# Patient Record
Sex: Female | Born: 1970 | Race: Black or African American | Hispanic: No | Marital: Married | Smoking: Never smoker
Health system: Southern US, Community
[De-identification: ages and names within clinical notes are randomized; demographics above are authoritative.]

## PROBLEM LIST (undated history)

## (undated) DIAGNOSIS — Z8041 Family history of malignant neoplasm of ovary: Secondary | ICD-10-CM

## (undated) DIAGNOSIS — M722 Plantar fascial fibromatosis: Secondary | ICD-10-CM

## (undated) DIAGNOSIS — K219 Gastro-esophageal reflux disease without esophagitis: Secondary | ICD-10-CM

## (undated) DIAGNOSIS — N920 Excessive and frequent menstruation with regular cycle: Secondary | ICD-10-CM

## (undated) DIAGNOSIS — D75839 Thrombocytosis, unspecified: Secondary | ICD-10-CM

## (undated) DIAGNOSIS — F419 Anxiety disorder, unspecified: Secondary | ICD-10-CM

## (undated) DIAGNOSIS — Z803 Family history of malignant neoplasm of breast: Secondary | ICD-10-CM

## (undated) DIAGNOSIS — D473 Essential (hemorrhagic) thrombocythemia: Secondary | ICD-10-CM

## (undated) DIAGNOSIS — D649 Anemia, unspecified: Secondary | ICD-10-CM

## (undated) HISTORY — DX: Essential (hemorrhagic) thrombocythemia: D47.3

## (undated) HISTORY — DX: Thrombocytosis, unspecified: D75.839

## (undated) HISTORY — DX: Excessive and frequent menstruation with regular cycle: N92.0

## (undated) HISTORY — DX: Anemia, unspecified: D64.9

## (undated) HISTORY — DX: Anxiety disorder, unspecified: F41.9

## (undated) HISTORY — DX: Family history of malignant neoplasm of breast: Z80.3

## (undated) HISTORY — DX: Family history of malignant neoplasm of ovary: Z80.41

## (undated) HISTORY — PX: CHOLECYSTECTOMY: SHX55

## (undated) HISTORY — DX: Gastro-esophageal reflux disease without esophagitis: K21.9

## (undated) HISTORY — DX: Plantar fascial fibromatosis: M72.2

## (undated) HISTORY — PX: BREAST SURGERY: SHX581

## (undated) HISTORY — PX: ABDOMINAL HYSTERECTOMY: SHX81

---

## 1998-03-05 HISTORY — PX: GASTRIC BYPASS: SHX52

## 2007-09-30 ENCOUNTER — Other Ambulatory Visit: Payer: Self-pay

## 2007-09-30 ENCOUNTER — Emergency Department: Payer: Self-pay | Admitting: Emergency Medicine

## 2014-03-05 DIAGNOSIS — Z1371 Encounter for nonprocreative screening for genetic disease carrier status: Secondary | ICD-10-CM

## 2014-03-05 HISTORY — DX: Encounter for nonprocreative screening for genetic disease carrier status: Z13.71

## 2014-06-08 ENCOUNTER — Inpatient Hospital Stay
Admit: 2014-06-08 | Disposition: A | Discharge status: Home / Self Care | Payer: Self-pay | Attending: Internal Medicine | Admitting: Internal Medicine

## 2014-06-08 DIAGNOSIS — I340 Nonrheumatic mitral (valve) insufficiency: Secondary | ICD-10-CM | POA: Diagnosis not present

## 2014-06-08 LAB — FERRITIN: FERRITIN (ARMC): 2 ng/mL — AB

## 2014-06-08 LAB — CBC
HCT: 14.6 % — AB (ref 35.0–47.0)
HGB: 3.7 g/dL — AB (ref 12.0–16.0)
MCH: 16.2 pg — ABNORMAL LOW (ref 26.0–34.0)
MCHC: <26.5 g/dL (ref 32.0–36.0)
MCV: 64 fL — AB (ref 80–100)
Platelet: 927 10*3/uL — ABNORMAL HIGH (ref 150–440)
RBC: 2.29 10*6/uL — AB (ref 3.80–5.20)
RDW: 42.5 % — ABNORMAL HIGH (ref 11.5–14.5)
WBC: 6.5 10*3/uL (ref 3.6–11.0)

## 2014-06-08 LAB — DIFFERENTIAL
BASOS ABS: 0.1 10*3/uL (ref 0.0–0.1)
Basophil %: 1 %
Eosinophil #: 0.1 10*3/uL (ref 0.0–0.7)
Eosinophil %: 2.1 %
LYMPHS ABS: 1.6 10*3/uL (ref 1.0–3.6)
Lymphocyte %: 24.9 %
MONO ABS: 0.7 x10 3/mm (ref 0.2–0.9)
MONOS PCT: 10 %
NEUTROS ABS: 4 10*3/uL (ref 1.4–6.5)
Neutrophil %: 62 %

## 2014-06-08 LAB — COMPREHENSIVE METABOLIC PANEL
ANION GAP: 9 (ref 7–16)
Albumin: 3.6 g/dL
Alkaline Phosphatase: 50 U/L
BUN: 6 mg/dL
Bilirubin,Total: 0.2 mg/dL — ABNORMAL LOW
CALCIUM: 8.3 mg/dL — AB
CHLORIDE: 109 mmol/L
CREATININE: 0.45 mg/dL
Co2: 22 mmol/L
EGFR (African American): >60
Glucose: 86 mg/dL
POTASSIUM: 3.7 mmol/L
SGOT(AST): 23 U/L
SGPT (ALT): 16 U/L
SODIUM: 140 mmol/L
Total Protein: 6.6 g/dL

## 2014-06-08 LAB — IRON AND TIBC
IRON SATURATION: 1.1
Iron Bind.Cap.(Total): 545 — ABNORMAL HIGH (ref 250–450)
Iron: 6 ug/dL — ABNORMAL LOW
Unbound Iron-Bind.Cap.: 539.4

## 2014-06-08 LAB — PRO B NATRIURETIC PEPTIDE: B-Type Natriuretic Peptide: 175 pg/mL — ABNORMAL HIGH

## 2014-06-08 LAB — CK TOTAL AND CKMB (NOT AT ARMC)
CK, Total: 86 U/L
CK-MB: 0.6 ng/mL

## 2014-06-08 LAB — TROPONIN I: Troponin-I: <0.03 ng/mL

## 2014-06-09 LAB — BASIC METABOLIC PANEL
Anion Gap: 5 — ABNORMAL LOW (ref 7–16)
BUN: 7 mg/dL
CALCIUM: 7.8 mg/dL — AB
CHLORIDE: 108 mmol/L
Co2: 25 mmol/L
Creatinine: 0.39 mg/dL — ABNORMAL LOW
GLUCOSE: 79 mg/dL
Potassium: 3.5 mmol/L
Sodium: 138 mmol/L

## 2014-06-09 LAB — CBC WITH DIFFERENTIAL/PLATELET
Basophil #: 0.1 10*3/uL (ref 0.0–0.1)
Basophil %: 1.8 %
EOS ABS: 0.2 10*3/uL (ref 0.0–0.7)
Eosinophil %: 3.2 %
HCT: 14 % — CL (ref 35.0–47.0)
HGB: 3.8 g/dL — AB (ref 12.0–16.0)
LYMPHS ABS: 1.6 10*3/uL (ref 1.0–3.6)
Lymphocyte %: 29.3 %
MCH: 17.8 pg — AB (ref 26.0–34.0)
MCHC: 27.1 g/dL — AB (ref 32.0–36.0)
MCV: 66 fL — ABNORMAL LOW (ref 80–100)
Monocyte #: 0.5 x10 3/mm (ref 0.2–0.9)
Monocyte %: 8.8 %
NEUTROS PCT: 56.9 %
Neutrophil #: 3.1 10*3/uL (ref 1.4–6.5)
PLATELETS: 916 10*3/uL — AB (ref 150–440)
RBC: 2.14 10*6/uL — AB (ref 3.80–5.20)
RDW: 41.6 % — ABNORMAL HIGH (ref 11.5–14.5)
WBC: 5.5 10*3/uL (ref 3.6–11.0)

## 2014-06-10 LAB — CBC WITH DIFFERENTIAL/PLATELET
BASOS ABS: 0.1 10*3/uL (ref 0.0–0.1)
BASOS PCT: 1.9 %
Eosinophil #: 0.3 10*3/uL (ref 0.0–0.7)
Eosinophil %: 4.3 %
HCT: 20.3 % — AB (ref 35.0–47.0)
HGB: 5.9 g/dL — AB (ref 12.0–16.0)
LYMPHS PCT: 26.3 %
Lymphocyte #: 1.9 10*3/uL (ref 1.0–3.6)
MCH: 20.5 pg — AB (ref 26.0–34.0)
MCHC: 29.2 g/dL — ABNORMAL LOW (ref 32.0–36.0)
MCV: 70 fL — ABNORMAL LOW (ref 80–100)
Monocyte #: 0.7 x10 3/mm (ref 0.2–0.9)
Monocyte %: 9.3 %
NEUTROS ABS: 4.3 10*3/uL (ref 1.4–6.5)
Neutrophil %: 58.2 %
Platelet: 911 10*3/uL — ABNORMAL HIGH (ref 150–440)
RBC: 2.9 10*6/uL — ABNORMAL LOW (ref 3.80–5.20)
RDW: 39 % — ABNORMAL HIGH (ref 11.5–14.5)
WBC: 7.4 10*3/uL (ref 3.6–11.0)

## 2014-06-10 LAB — PREGNANCY, URINE: Pregnancy Test, Urine: NEGATIVE m[IU]/mL

## 2014-06-10 LAB — OCCULT BLOOD X 1 CARD TO LAB, STOOL: OCCULT BLOOD, FECES: NEGATIVE

## 2014-06-24 ENCOUNTER — Ambulatory Visit
Admit: 2014-06-24 | Disposition: A | Discharge status: Home / Self Care | Payer: Self-pay | Attending: Hematology and Oncology | Admitting: Hematology and Oncology

## 2014-06-24 LAB — CBC CANCER CENTER
Basophil #: 0.1 x10 3/mm (ref 0.0–0.1)
Basophil %: 2.2 %
Basophil: 1 %
Eosinophil #: 0.3 x10 3/mm (ref 0.0–0.7)
Eosinophil %: 5.9 %
Eosinophil: 5 %
HCT: 30.5 % — ABNORMAL LOW (ref 35.0–47.0)
HGB: 8.9 g/dL — ABNORMAL LOW (ref 12.0–16.0)
Lymphocyte #: 1.5 x10 3/mm (ref 1.0–3.6)
Lymphocyte %: 30.5 %
Lymphocytes: 34 %
MCH: 21.8 pg — ABNORMAL LOW (ref 26.0–34.0)
MCHC: 29.3 g/dL — ABNORMAL LOW (ref 32.0–36.0)
MCV: 74 fL — ABNORMAL LOW (ref 80–100)
Monocyte #: 0.6 x10 3/mm (ref 0.2–0.9)
Monocyte %: 11.3 %
Monocytes: 7 %
Neutrophil #: 2.5 x10 3/mm (ref 1.4–6.5)
Neutrophil %: 50.1 %
Platelet: 52 x10 3/mm — ABNORMAL LOW (ref 150–440)
RBC: 4.1 10*6/uL (ref 3.80–5.20)
RDW: 34.8 % — ABNORMAL HIGH (ref 11.5–14.5)
Segmented Neutrophils: 53 %
WBC: 4.9 x10 3/mm (ref 3.6–11.0)

## 2014-06-24 LAB — IRON AND TIBC
Iron Bind.Cap.(Total): 525 — ABNORMAL HIGH (ref 250–450)
Iron Saturation: 20.6
Iron: 108 ug/dL
Unbound Iron-Bind.Cap.: 416.6

## 2014-06-24 LAB — FERRITIN: Ferritin (ARMC): 6 ng/mL — ABNORMAL LOW

## 2014-06-24 LAB — RETICULOCYTES
Absolute Retic Count: 0.0773 10*6/uL (ref 0.019–0.186)
Reticulocyte: 1.89 % (ref 0.4–3.1)

## 2014-06-24 LAB — FOLATE: Folic Acid: 31 ng/mL

## 2014-06-25 ENCOUNTER — Telehealth: Payer: Self-pay | Admitting: Genetic Counselor

## 2014-06-25 LAB — CBC CANCER CENTER
Basophil #: 0.1 x10 3/mm (ref 0.0–0.1)
Basophil %: 2.3 %
Eosinophil #: 0.3 x10 3/mm (ref 0.0–0.7)
Eosinophil %: 6.2 %
HCT: 29 % — ABNORMAL LOW (ref 35.0–47.0)
HGB: 8.8 g/dL — ABNORMAL LOW (ref 12.0–16.0)
Lymphocyte #: 1.6 x10 3/mm (ref 1.0–3.6)
Lymphocyte %: 34.7 %
MCH: 22.4 pg — ABNORMAL LOW (ref 26.0–34.0)
MCHC: 30.3 g/dL — ABNORMAL LOW (ref 32.0–36.0)
MCV: 74 fL — ABNORMAL LOW (ref 80–100)
Monocyte #: 0.5 x10 3/mm (ref 0.2–0.9)
Monocyte %: 11.7 %
Neutrophil #: 2.1 x10 3/mm (ref 1.4–6.5)
Neutrophil %: 45.1 %
Platelet: 52 x10 3/mm — ABNORMAL LOW (ref 150–440)
RBC: 3.93 10*6/uL (ref 3.80–5.20)
RDW: 34.9 % — ABNORMAL HIGH (ref 11.5–14.5)
WBC: 4.6 x10 3/mm (ref 3.6–11.0)

## 2014-06-25 NOTE — Telephone Encounter (Signed)
GENETIC REFERRAL-LEFT MESSAGE FOR PATIENT TO RETURN CALL TO SCHEDULE APPT

## 2014-06-29 LAB — APTT: Activated PTT: 31.4 secs (ref 23.6–35.9)

## 2014-06-29 LAB — PROTIME-INR
INR: 1
Prothrombin Time: 13.3 secs

## 2014-06-30 ENCOUNTER — Encounter: Payer: Self-pay | Admitting: Genetic Counselor

## 2014-06-30 ENCOUNTER — Ambulatory Visit (HOSPITAL_BASED_OUTPATIENT_CLINIC_OR_DEPARTMENT_OTHER): Payer: Managed Care, Other (non HMO) | Admitting: Genetic Counselor

## 2014-06-30 ENCOUNTER — Other Ambulatory Visit: Payer: Managed Care, Other (non HMO)

## 2014-06-30 DIAGNOSIS — Z8041 Family history of malignant neoplasm of ovary: Secondary | ICD-10-CM | POA: Diagnosis not present

## 2014-06-30 DIAGNOSIS — Z315 Encounter for genetic counseling: Secondary | ICD-10-CM

## 2014-06-30 DIAGNOSIS — Z803 Family history of malignant neoplasm of breast: Secondary | ICD-10-CM | POA: Diagnosis not present

## 2014-06-30 NOTE — Progress Notes (Signed)
REFERRING PROVIDER: Melissa Corcoran, MD  PRIMARY PROVIDER:  No primary care provider on file.  PRIMARY REASON FOR VISIT:  1. Family history of ovarian cancer   2. Family history of breast cancer      HISTORY OF PRESENT ILLNESS:   [name], a 44 y.o. female, was seen for a [place] cancer genetics consultation at the request of Dr. Corcoran due to a family history of cancer.  Ms. Mucha presents to clinic today to discuss the possibility of a hereditary predisposition to cancer, genetic testing, and to further clarify her future cancer risks, as well as potential cancer risks for family members. [name] has no personal history of cancer.  She is undergoing a hysterectomy on Jul 20, 2014, and wants to know if she should also have her ovaries removed.  CANCER HISTORY:   No history exists.     HORMONAL RISK FACTORS:  Menarche was at age 11.  First live birth at age 24.  OCP use for approximately 1 years.  Ovaries intact: yes.  Hysterectomy: no, but scheduled for 07/20/2014.  Menopausal status: premenopausal.  HRT use: 0 years. Colonoscopy: no; not examined. Mammogram within the last year: yes. Number of breast biopsies: 0. Up to date with pelvic exams:  no. Any excessive radiation exposure in the past:  no  Past Medical History  Diagnosis Date  . Family history of ovarian cancer   . Family history of breast cancer     History reviewed. No pertinent past surgical history.  History   Social History  . Marital Status: Married    Spouse Name: N/A  . Number of Children: 1  . Years of Education: N/A   Social History Main Topics  . Smoking status: Never Smoker   . Smokeless tobacco: Not on file  . Alcohol Use: Yes     Comment: socially  . Drug Use: Not on file  . Sexual Activity: Not on file   Other Topics Concern  . None   Social History Narrative  . None     FAMILY HISTORY:  We obtained a detailed, 4-generation family history.  Significant diagnoses  are listed below: Family History  Problem Relation Age of Onset  . Ovarian cancer Mother 50  . Breast cancer Maternal Aunt 65  . Cancer Paternal Aunt     either cervical or ovarian cancer  . Cervical cancer Maternal Aunt     reportedly the cause of death was cervical cancer  . Lung cancer Paternal Aunt    The patient has one maternal half brother, two full brothers and one full sister, none of whom have cancer.  Her mother had ovarian cancer at age 50 and died at 53.  Her mother had a maternal half sister, two full sisters and a full brother.  The half sister has passed away from unknown causes, a full sister has died due to cervical cancer, and the other sister had breast cancer at 65.  This sister has a daughter who had a brain tumor at 31 and breast cancer at 45, and died at 52.  The brother is alive and has a daughter with breast cancer in her 40s.  [name] is unaware whether her cousins have had genetic testing.  [name]'s father had five brother and two sisters.  The sisters died of lung cancer and eitehr ovarian or cervical cancer. Patient's maternal ancestors are of Native American and African American descent, and paternal ancestors are of African American descent. There   is no reported Ashkenazi Jewish ancestry. There is no known consanguinity.  GENETIC COUNSELING ASSESSMENT: [name] is a 44 y.o. female with a family history of breast and ovarian cancer which somewhat suggestive of a hereditary cancer syndrome and predisposition to cancer. We, therefore, discussed and recommended the following at today's visit.   DISCUSSION: We reviewed the characteristics, features and inheritance patterns of hereditary cancer syndromes.We reviewed common reasons for breast and ovarian cancer including BRCA mutations, as well as others.  We discussed that based on the NCCN guidelines there were several different genes that, if pathogenic mutations were found, would have recommendations for  removal of ovaries.  These include the Lynch syndrome genes, as well as BRCA1, BRCA2 and BRIP1. We also discussed genetic testing, including the appropriate family members to test, the process of testing, insurance coverage and turn-around-time for results. We discussed the implications of a negative, positive and/or variant of uncertain significant result. We recommended Ms. Illingworth pursue genetic testing for the Breast/Ovarian Cancer gene panel. The Breast/Ovarian gene panel offered by [facility] includes sequencing and rearrangement analysis for the following 21 genes:  ATM, BARD1, BRCA1, BRCA2, BRIP1, CDH1, CHEK2, EPCAM, FANCC, MLH1, MSH2, MSH6, NBN, PALB2, PMS2, PTEN, RAD51C, RAD51D, STK11, TP53, and XRCC2.     We discussed that genetic testing of these genes does not tell you if you have cancer, but will let you know if you are at an increased risk for getting cancer.  Therefore, they are predictive, but NOT diagnostic.  In order to estimate her chance of having a BRCA mutation, we used statistical models (Tyrer Cusik, [place] II and Myriad risk Calculator) and laboratory data that take into account her personal medical history, family history and ancestry.  Because each model is different, there can be a lot of variability in the risks they give.  Therefore, these numbers must be considered a rough range and not a precise risk of having a BRCA mutation.  These models estimate that she has approximately a 5.79-10% chance of having a mutation. Based on this assessment of her family and personal history, genetic testing is recommended.  Based on the patient's personal and family history, statistical models (Tyrer Cusik)  and literature data were used to estimate her risk of developing breast cancer. These estimate her lifetime risk of developing breast cancer to be approximately 19.5%. This estimation does not take into account any genetic testing results.  The patient's lifetime breast cancer risk is a  preliminary estimate based on available information using one of several models endorsed by the American Cancer Society (ACS). The ACS recommends consideration of breast MRI screening as an adjunct to mammography for patients at high risk (defined as 20% or greater lifetime risk). A more detailed breast cancer risk assessment can be considered, if clinically indicated.   PLAN: After considering the risks, benefits, and limitations, Ms. Fobes  provided informed consent to pursue genetic testing and the blood sample was sent to [facility] for analysis of the Breast/Ovarian Cancer gene panel. Results should be available within approximately 2-3 weeks' time, at which point they will be disclosed by telephone to Ms. Seymore, as will any additional recommendations warranted by these results. Ms. Misuraca will receive a summary of her genetic counseling visit and a copy of her results once available. This information will also be available in Epic. We encouraged Ms. Star to remain in contact with cancer genetics annually so that we can continuously update the family history and inform her of   any changes in cancer genetics and testing that may be of benefit for her family. Ms. Flagg's questions were answered to her satisfaction today. Our contact information was provided should additional questions or concerns arise.  Based on Ms. Hietpas's family history, we recommended her maternal cousins, who were diagnosed with breast cancer in their 40s, have genetic counseling and testing. Ms. Winningham will let us know if we can be of any assistance in coordinating genetic counseling and/or testing for this family member.   Lastly, we encouraged Ms. Lannen to remain in contact with cancer genetics annually so that we can continuously update the family history and inform her of any changes in cancer genetics and testing that may be of benefit for this family.   Ms.  Daoud's questions were answered to her  satisfaction today. Our contact information was provided should additional questions or concerns arise. Thank you for the referral and allowing us to share in the care of your patient.   Karen P. Powell, MS, CGC Certified Genetic Counselor [email or web address] phone: [phone]  The patient was seen for a total of 60 minutes in face-to-face genetic counseling.  This patient was discussed with Drs. Magrinat, Gudena and/or Feng who agrees with the above.    _______________________________________________________________________ For Office Staff:  Number of people involved in session: 1 Was an Intern/ student involved with case: no    

## 2014-07-01 LAB — URINE IEP, RANDOM

## 2014-07-02 LAB — LACTATE DEHYDROGENASE: LDH: 181 U/L

## 2014-07-02 LAB — CBC CANCER CENTER
Basophil #: 0.1 x10 3/mm (ref 0.0–0.1)
Basophil %: 1.9 %
Eosinophil #: 0.4 x10 3/mm (ref 0.0–0.7)
Eosinophil %: 7.2 %
HCT: 31 % — ABNORMAL LOW (ref 35.0–47.0)
HGB: 9.2 g/dL — ABNORMAL LOW (ref 12.0–16.0)
Lymphocyte #: 1.6 x10 3/mm (ref 1.0–3.6)
Lymphocyte %: 27.4 %
MCH: 23 pg — ABNORMAL LOW (ref 26.0–34.0)
MCHC: 29.7 g/dL — ABNORMAL LOW (ref 32.0–36.0)
MCV: 77 fL — ABNORMAL LOW (ref 80–100)
Monocyte #: 0.5 x10 3/mm (ref 0.2–0.9)
Monocyte %: 9.3 %
Neutrophil #: 3.2 x10 3/mm (ref 1.4–6.5)
Neutrophil %: 54.2 %
Platelet: 377 x10 3/mm (ref 150–440)
RBC: 4.01 10*6/uL (ref 3.80–5.20)
RDW: 35 % — ABNORMAL HIGH (ref 11.5–14.5)
WBC: 5.8 x10 3/mm (ref 3.6–11.0)

## 2014-07-02 LAB — RETICULOCYTES
Absolute Retic Count: 0.0761 10*6/uL (ref 0.019–0.186)
Reticulocyte: 1.9 % (ref 0.4–3.1)

## 2014-07-02 LAB — FERRITIN: Ferritin (ARMC): 7 ng/mL — ABNORMAL LOW

## 2014-07-04 NOTE — Discharge Summary (Signed)
PATIENT NAME:  Jasmine Tucker, Jasmine Tucker MR#:  [id number] DATE OF BIRTH:  1970/04/24  DATE OF ADMISSION:  06/08/2014 DATE OF DISCHARGE:    DISCHARGE DIAGNOSES: 1. Menorrhagia.  2. Iron deficiency anemia, severe, secondary to menorrhagia.  3. Uterine fibroids.  4. Thrombocytosis.   DISCHARGE MEDICATIONS: 1. Alprazolam 0.5 mg oral once a day as needed for anxiety.  2. Ferrous sulfate 324 mg oral 2 times a day with meals.   CONSULTS: Dr. Kenton Kingfisher with gynecology.   IMAGING STUDIES:  1. Pelvic ultrasound pending.  2. Chest x-ray showed nothing acute.   ADMITTING HISTORY AND PHYSICAL: Please see detailed H and P dictated previously by Dr. Posey Pronto. In brief, a 44 year old African American female patient with history of menorrhagia presented to the hospital complaining of shortness of breath, tachycardia, was found to have severe anemia of 3.6, admitted to hospitalist service. The patient was transfused total of 4 units of packed red blood cells during the hospital stay with which her symptomatic anemia has resolved. She feels significantly better. The patient does not have any bleeding at this point, was seen by gynecology, Dr. Kenton Kingfisher, who I have discussed the case with. The patient has a follow-up with him in 3 days. He will follow up on her pelvic ultrasound. At this point, the patient is stable to be discharged home to follow up with gynecology as outpatient. The patient does have thrombocytosis, likely secondary from the iron deficiency anemia. For this, I have  set up an appointment with her at the [facility]. Appointment at the [facility] is on 06/24/2014 with Dr. Mike Gip.   Prior to discharge, the patient's lungs sound clear. S1, S2 heard. No murmurs.  No edema.   TIME SPENT ON DAY OF DISCHARGE IN DISCHARGE ACTIVITY: Forty minutes.     ____________________________ Leia Alf Bret Vanessen, MD srs:tr D: 06/10/2014 13:23:42 ET T: 06/10/2014 16:31:53 ET JOB#: [id number]  cc: Alveta Heimlich R. Gaspard Isbell, MD,  <Dictator>  Neita Carp MD ELECTRONICALLY SIGNED 06/28/2014 10:49

## 2014-07-04 NOTE — H&P (Signed)
PATIENT NAME:  Jasmine Tucker, Jasmine Tucker MR#:  [id number] DATE OF BIRTH:  January 28, 1971  DATE OF ADMISSION:  06/08/2014  PRIMARY CARE PROVIDER: [facility]  REFERRING DOCTOR:  Debbrah Alar, M.D.    CHIEF COMPLAINT: Shortness of breath, tachycardia.   HISTORY OF PRESENT ILLNESS: The patient is a 44 year old African American female with previous history of gastric bypass who presents to the ED with complaint of shortness of breath ongoing for the past few days, progressively worse, with dyspnea on exertion, and generalized weakness. The patient reports that over the past 3 periods her menstruation has been very heavy. The patient came to the ED with these symptoms and had blood work checked that showed a hemoglobin of 3.7. She denies any chest pain.  Does complain of reflux-like symptoms. Denies any nausea, vomiting, or diarrhea. Denies any urinary frequency, urgency, or hesitancy.   PAST MEDICAL HISTORY: None.   PAST SURGICAL HISTORY: History of gastric bypass.   ALLERGIES: None.   MEDICATIONS: She states that she is on anxiety pill as needed, but not on a daily basis.   SOCIAL HISTORY: Does not smoke, does not drink. No drugs.   FAMILY HISTORY: Positive for diabetes.   REVIEW OF SYSTEMS:  CONSTITUTIONAL: Denies any fevers, chills. No weight loss, no weight gain.  EYES: No blurred or double vision. No redness. No inflammation. No glaucoma.  EARS, NOSE, AND THROAT: No tinnitus. No ear pain. No hearing loss. No seasonal or year-round allergies. No difficulty swallowing.  RESPIRATORY: Denies any cough, wheezing, hemoptysis. Complains of dyspnea. No asthma. CARDIOVASCULAR: Denies any chest pain. No orthopnea or edema. Complains of dyspnea on exertion. No palpitations.  GASTROINTESTINAL: No nausea, vomiting, diarrhea. No abdominal pain. No hematemesis. Complains of GERD-like symptoms. GENITOURINARY:  Denies any dysuria or hematuria, renal calculus or frequency.  ENDOCRINE: Denies any  polyuria, nocturia, thyroid problems. HEMATOLOGIC AND LYMPHATIC:  No history of anemia according to her of which she knows.  SKIN: No acne. No rash.  MUSCULOSKELETAL: No pain in the neck, back, or shoulder.  NEUROLOGIC: No numbness, CVA, transient ischemic attack, seizures.  PSYCHIATRIC: History of anxiety, no insomnia.   PHYSICAL EXAMINATION:  VITAL SIGNS: Temperature 98, pulse 78, respirations 20, blood pressure 133/60.  GENERAL: The patient is an obese female in no acute distress.  HEENT: There is conjunctival pallor. No sclerae icterus. Extraocular movements intact.  Nasal exam shows no drainage or ulceration.  Oropharynx is clear without any exudate.  NECK: Supple without any thyromegaly.  CARDIOVASCULAR: Regular rate and rhythm. No murmurs, rubs.  LUNGS: Clear to auscultation. ABDOMEN: Soft, nontender, nondistended. Positive bowel sounds x 4. No hepatosplenomegaly.  EXTREMITIES: No clubbing, cyanosis, or edema.  SKIN: No rash.  LYMPH NODES: Nonpalpable.  VASCULAR: Good DP/PT pulses.  PSYCHIATRIC: Not anxious or depressed.  NEUROLOGICAL: Awake, alert, oriented x 3. No focal deficits.   EVALUATION:  EKG showed normal sinus rhythm with nonspecific T wave changes.   LABORATORY DATA: Glucose 86, BUN 6, creatinine 0.45, sodium 140, potassium 3.7, chloride 109, CO2 22, calcium 8.3. LFTs are normal except low bilirubin total.  CPK 86. Troponin less than 0.03. WBC 6.5, hemoglobin 3.7, platelet count 977,000, MCV of 64. Chest x-ray does show enlargement of cardiac silhouette with pulmonary vascular congestion.   ASSESSMENT AND PLAN: The patient is a 44 year old with recent heavy menstruation presents with shortness of breath, weakness, severe anemia. 1.  Severe asymptomatic anemia due to menstrual blood loss. At this time, patient consented to transfusion.  Gave  2 units of packed red blood cells.  Needs outpatient GYN followup.  Currently not bleeding. The patient will be given Lasix prior to  her transfusion.   2.  Abnormal chest x-ray possibly related to patient's body habitus. Will check a BNP and echocardiogram.  3.  Elevated platelets, reactive, due to anemia.  4.  Gastroesophageal reflux disease. Will place her on PPI.  5.  Miscellaneous. She will be on sequential compression devices for deep vein thrombosis prophylaxis.  TIME SPENT: 50 minutes on this patient's history and physical.   ____________________________ Lafonda Mosses. Posey Pronto, MD shp:sp D: 06/08/2014 11:56:00 ET T: 06/08/2014 12:08:32 ET JOB#: [id number]  cc: Maricela Schreur H. Posey Pronto, MD, <Dictator Alric Seton MD ELECTRONICALLY SIGNED 06/09/2014 20:21

## 2014-07-06 LAB — PROT IMMUNOELECTROPHORES(ARMC)

## 2014-07-09 ENCOUNTER — Other Ambulatory Visit: Payer: Managed Care, Other (non HMO)

## 2014-07-09 ENCOUNTER — Ambulatory Visit: Payer: Managed Care, Other (non HMO) | Admitting: Hematology and Oncology

## 2014-07-10 ENCOUNTER — Telehealth: Payer: Self-pay | Admitting: Hematology and Oncology

## 2014-07-10 NOTE — Telephone Encounter (Signed)
The patient called on 07/08/2014 to discuss her recent work-up.  Platelet count was normal. Hematocrit had to slightly improved. Her iron stores were low. She was taking oral iron. Her menstrual period had stopped. There was no evidence of hemolysis.  I discussed her plan for hysterectomy on 07/20/2014.  O discussed contact with her gynecologist office to determine goal hematocrit. Hopefully with a good diet and taking her oral iron, she will not to require any additional support (IV iron). A complete blood count should be drawn on 07/19/2014. The patient felt comfortable with this plan.

## 2014-07-12 ENCOUNTER — Telehealth: Payer: Self-pay

## 2014-07-13 ENCOUNTER — Telehealth: Payer: Self-pay | Admitting: Genetic Counselor

## 2014-07-13 ENCOUNTER — Encounter: Payer: Self-pay | Admitting: Genetic Counselor

## 2014-07-13 DIAGNOSIS — Z1379 Encounter for other screening for genetic and chromosomal anomalies: Secondary | ICD-10-CM | POA: Insufficient documentation

## 2014-07-13 DIAGNOSIS — Z803 Family history of malignant neoplasm of breast: Secondary | ICD-10-CM

## 2014-07-13 DIAGNOSIS — Z8041 Family history of malignant neoplasm of ovary: Secondary | ICD-10-CM

## 2014-07-13 NOTE — Telephone Encounter (Signed)
Attempted to call patient at 17:00. No answer. Will call patient back tomorrow. Jasmine Tucker

## 2014-07-13 NOTE — Telephone Encounter (Signed)
Revealed negative genetic testing.  She asked that I fax a copy of her results to her GYN, Dr. Barnett Applebaum.

## 2014-07-13 NOTE — Progress Notes (Signed)
HPI: Ms. Stouffer was previously seen in the [facility] clinic due to a family history of cancer and concerns regarding a hereditary predisposition to cancer. Please refer to our prior cancer genetics clinic note for more information regarding Ms. Stickels's medical, social and family histories, and our assessment and recommendations, at the time. Ms. Amberg recent genetic test results were disclosed to her, as were recommendations warranted by these results. These results and recommendations are discussed in more detail below.  GENETIC TEST RESULTS: At the time of Ms. Wickey's visit, we recommended she pursue genetic testing of the Breast/Ovarian cancer gene panel. The Breast/Ovarian gene panel offered by [facility] includes sequencing and rearrangement analysis for the following 21 genes:  ATM, BARD1, BRCA1, BRCA2, BRIP1, CDH1, CHEK2, EPCAM, FANCC, MLH1, MSH2, MSH6, NBN, PALB2, PMS2, PTEN, RAD51C, RAD51D, STK11, TP53, and XRCC2.   The report date is Jul 09, 2014.  Genetic testing was normal, and did not reveal a deleterious mutation in these genes. The test report has been scanned into EPIC and is located under the Media tab.   We discussed with Ms. Mcdiarmid that since the current genetic testing is not perfect, it is possible there may be a gene mutation in one of these genes that current testing cannot detect, but that chance is small. We also discussed, that it is possible that another gene that has not yet been discovered, or that we have not yet tested, is responsible for the cancer diagnoses in the family, and it is, therefore, important to remain in touch with cancer genetics in the future so that we can continue to offer Ms. Hiltz the most up to date genetic testing.   CANCER SCREENING RECOMMENDATIONS: This normal result is reassuring and indicates that Ms. Raybourn does not likely have an increased risk of cancer due to a mutation in one of these genes.  We, therefore, recommended   Ms. Salameh continue to follow the cancer screening guidelines provided by her primary healthcare providers.   RECOMMENDATIONS FOR FAMILY MEMBERS: Women in this family might be at some increased risk of developing cancer, over the general population risk, simply due to the family history of cancer. We recommended women in this family have a yearly mammogram beginning at age 43, or 34 years younger than the earliest onset of cancer, an an annual clinical breast exam, and perform monthly breast self-exams. Women in this family should also have a gynecological exam as recommended by their primary provider. All family members should have a colonoscopy by age 35.  Based on Ms. Barca's family history, we recommended her maternal cousin, who was diagnosed with breast cancer at age 54, have genetic counseling and testing. Ms. Ehresman will let us know if we can be of any assistance in coordinating genetic counseling and/or testing for this family member.   FOLLOW-UP: Lastly, we discussed with Ms. Vasbinder that cancer genetics is a rapidly advancing field and it is possible that new genetic tests will be appropriate for her and/or her family members in the future. We encouraged her to remain in contact with cancer genetics on an annual basis so we can update her personal and family histories and let her know of advances in cancer genetics that may benefit this family.   Our contact number was provided. Ms. Jacquot questions were answered to her satisfaction, and she knows she is welcome to call us at anytime with additional questions or concerns.   Roma Kayser, MS, [facility] Certified Genetic Counselor [email or web address]

## 2014-07-13 NOTE — Telephone Encounter (Signed)
Spoke with Mahlani this AM telling her that we had good news, but she needed to call back at a different time.  Her PCPs office, Dr. Barnett Applebaum, called looking for a copy of the results.  I explained that I had not disclosed them to the patient, but will fax them to the office once I get a copy.  Their fax number is [phone].  Called Shilah again and LM on VM with good news and asked that she call so that I can disclose them to her and then fax them to her Dr.'s office.

## 2014-07-14 ENCOUNTER — Encounter
Admission: RE | Admit: 2014-07-14 | Discharge: 2014-07-14 | Disposition: A | Discharge status: Home / Self Care | Payer: Managed Care, Other (non HMO) | Source: Ambulatory Visit | Attending: Obstetrics & Gynecology | Admitting: Obstetrics & Gynecology

## 2014-07-14 DIAGNOSIS — D250 Submucous leiomyoma of uterus: Secondary | ICD-10-CM | POA: Insufficient documentation

## 2014-07-14 DIAGNOSIS — D500 Iron deficiency anemia secondary to blood loss (chronic): Secondary | ICD-10-CM | POA: Insufficient documentation

## 2014-07-14 DIAGNOSIS — Z01812 Encounter for preprocedural laboratory examination: Secondary | ICD-10-CM | POA: Insufficient documentation

## 2014-07-14 DIAGNOSIS — N920 Excessive and frequent menstruation with regular cycle: Secondary | ICD-10-CM | POA: Insufficient documentation

## 2014-07-14 LAB — CBC
HCT: 34.5 % — ABNORMAL LOW (ref 35.0–47.0)
Hemoglobin: 10.6 g/dL — ABNORMAL LOW (ref 12.0–16.0)
MCH: 23.9 pg — AB (ref 26.0–34.0)
MCHC: 30.7 g/dL — ABNORMAL LOW (ref 32.0–36.0)
MCV: 78.1 fL — ABNORMAL LOW (ref 80.0–100.0)
Platelets: 443 10*3/uL — ABNORMAL HIGH (ref 150–440)
RBC: 4.42 MIL/uL (ref 3.80–5.20)
RDW: 29.8 % — ABNORMAL HIGH (ref 11.5–14.5)
WBC: 5.5 10*3/uL (ref 3.6–11.0)

## 2014-07-14 LAB — TYPE AND SCREEN
ABO/RH(D): O POS
Antibody Screen: NEGATIVE

## 2014-07-14 NOTE — Patient Instructions (Signed)
  Your procedure is scheduled on:Jul 20, 2014 Report to Same Day Surgery. To find out your arrival time please call [phone] between 1PM - 3PM on Jul 19, 2014  Remember: Instructions that are not followed completely may result in serious medical risk, up to and including death, or upon the discretion of your surgeon and anesthesiologist your surgery may need to be rescheduled.    __x__ 1. Do not eat food or drink liquids after midnight. No gum chewing or hard candies.     __x__ 2. No Alcohol for 24 hours before or after surgery.   ____ 3. Bring all medications with you on the day of surgery if instructed.    __x__ 4. Notify your doctor if there is any change in your medical condition     (cold, fever, infections).     Do not wear jewelry, make-up, hairpins, clips or nail polish.  Do not wear lotions, powders, or perfumes. You may wear deodorant.  Do not shave 48 hours prior to surgery. Men may shave face and neck.  Do not bring valuables to the hospital.    [facility] is not responsible for any belongings or valuables.               Contacts, dentures or bridgework may not be worn into surgery.  Leave your suitcase in the car. After surgery it may be brought to your room.  For patients admitted to the hospital, discharge time is determined by your                treatment team.   Patients discharged the day of surgery will not be allowed to drive home.   Please read over the following fact sheets that you were given:   Surgical Site Infection Prevention   ____ Take these medicines the morning of surgery with A SIP OF WATER:    1.   2.   3.   4.  5.  6.  ____ Fleet Enema (as directed)   __x__ Use CHG Soap as directed  ____ Use inhalers on the day of surgery  ____ Stop metformin 2 days prior to surgery    ____ Take 1/2 of usual insulin dose the night before surgery and none on the morning of surgery.   ____ Stop Coumadin/Plavix/aspirin on ____ Stop  Anti-inflammatories on   ____ Stop supplements until after surgery.    ____ Bring C-Pap to the hospital.

## 2014-07-20 ENCOUNTER — Encounter: Payer: Self-pay | Admitting: *Deleted

## 2014-07-20 ENCOUNTER — Observation Stay
Admission: RE | Admit: 2014-07-20 | Discharge: 2014-07-21 | Disposition: A | Discharge status: Home / Self Care | Payer: Managed Care, Other (non HMO) | Source: Ambulatory Visit | Attending: Obstetrics & Gynecology | Admitting: Obstetrics & Gynecology

## 2014-07-20 ENCOUNTER — Inpatient Hospital Stay: Payer: Managed Care, Other (non HMO) | Admitting: Anesthesiology

## 2014-07-20 ENCOUNTER — Encounter
Admission: RE | Disposition: A | Discharge status: Home / Self Care | Payer: Self-pay | Source: Ambulatory Visit | Attending: Obstetrics & Gynecology

## 2014-07-20 DIAGNOSIS — Z9049 Acquired absence of other specified parts of digestive tract: Secondary | ICD-10-CM | POA: Insufficient documentation

## 2014-07-20 DIAGNOSIS — F17200 Nicotine dependence, unspecified, uncomplicated: Secondary | ICD-10-CM | POA: Diagnosis not present

## 2014-07-20 DIAGNOSIS — F419 Anxiety disorder, unspecified: Secondary | ICD-10-CM | POA: Diagnosis not present

## 2014-07-20 DIAGNOSIS — D219 Benign neoplasm of connective and other soft tissue, unspecified: Secondary | ICD-10-CM | POA: Diagnosis present

## 2014-07-20 DIAGNOSIS — N830 Follicular cyst of ovary: Secondary | ICD-10-CM | POA: Diagnosis not present

## 2014-07-20 DIAGNOSIS — K219 Gastro-esophageal reflux disease without esophagitis: Secondary | ICD-10-CM | POA: Insufficient documentation

## 2014-07-20 DIAGNOSIS — Z79899 Other long term (current) drug therapy: Secondary | ICD-10-CM | POA: Diagnosis not present

## 2014-07-20 DIAGNOSIS — Z803 Family history of malignant neoplasm of breast: Secondary | ICD-10-CM | POA: Diagnosis not present

## 2014-07-20 DIAGNOSIS — Z833 Family history of diabetes mellitus: Secondary | ICD-10-CM | POA: Insufficient documentation

## 2014-07-20 DIAGNOSIS — E669 Obesity, unspecified: Secondary | ICD-10-CM | POA: Insufficient documentation

## 2014-07-20 DIAGNOSIS — N800 Endometriosis of uterus: Secondary | ICD-10-CM | POA: Diagnosis not present

## 2014-07-20 DIAGNOSIS — D649 Anemia, unspecified: Secondary | ICD-10-CM | POA: Diagnosis not present

## 2014-07-20 DIAGNOSIS — N921 Excessive and frequent menstruation with irregular cycle: Secondary | ICD-10-CM | POA: Insufficient documentation

## 2014-07-20 DIAGNOSIS — D259 Leiomyoma of uterus, unspecified: Secondary | ICD-10-CM | POA: Diagnosis present

## 2014-07-20 DIAGNOSIS — Z8041 Family history of malignant neoplasm of ovary: Secondary | ICD-10-CM | POA: Diagnosis not present

## 2014-07-20 HISTORY — PX: LAPAROSCOPIC HYSTERECTOMY: SHX1926

## 2014-07-20 HISTORY — PX: LAPAROSCOPIC OOPHERECTOMY: SHX6507

## 2014-07-20 HISTORY — PX: LAPAROSCOPIC BILATERAL SALPINGECTOMY: SHX5889

## 2014-07-20 LAB — POCT PREGNANCY, URINE: Preg Test, Ur: NEGATIVE

## 2014-07-20 SURGERY — HYSTERECTOMY, TOTAL, LAPAROSCOPIC
Anesthesia: General

## 2014-07-20 MED ORDER — ONDANSETRON HCL 4 MG/2ML IJ SOLN
INTRAMUSCULAR | Status: DC | PRN
  Administered 2014-07-20: 4 mg via INTRAVENOUS

## 2014-07-20 MED ORDER — KETOROLAC TROMETHAMINE 30 MG/ML IJ SOLN: 30.0000 mg | Freq: Four times a day (QID) | INTRAMUSCULAR | Status: AC

## 2014-07-20 MED ORDER — BUPIVACAINE HCL 0.5 % IJ SOLN
INTRAMUSCULAR | Status: DC | PRN
  Administered 2014-07-20: 19 mL

## 2014-07-20 MED ORDER — CEFOXITIN SODIUM-DEXTROSE 2-2.2 GM-% IV SOLR (PREMIX)
2.0000 g | Freq: Once | INTRAVENOUS | Status: AC
  Administered 2014-07-20: 2000 mg via INTRAVENOUS

## 2014-07-20 MED ORDER — BISACODYL 10 MG RE SUPP: 10.0000 mg | Freq: Every day | RECTAL | Status: DC | PRN

## 2014-07-20 MED ORDER — ALPRAZOLAM 0.5 MG PO TABS: 0.5000 mg | ORAL_TABLET | Freq: Two times a day (BID) | ORAL | Status: DC | PRN

## 2014-07-20 MED ORDER — ZOLPIDEM TARTRATE 5 MG PO TABS: 5.0000 mg | ORAL_TABLET | Freq: Every evening | ORAL | Status: DC | PRN

## 2014-07-20 MED ORDER — SIMETHICONE 80 MG PO CHEW
80.0000 mg | CHEWABLE_TABLET | Freq: Four times a day (QID) | ORAL | Status: DC | PRN
  Administered 2014-07-20 – 2014-07-21 (×2): 80 mg via ORAL
  Filled 2014-07-20 (×2): qty 1

## 2014-07-20 MED ORDER — FENTANYL CITRATE (PF) 100 MCG/2ML IJ SOLN
INTRAMUSCULAR | Status: AC
  Filled 2014-07-20: qty 2

## 2014-07-20 MED ORDER — LACTATED RINGERS IV SOLN: INTRAVENOUS | Status: DC

## 2014-07-20 MED ORDER — DOCUSATE SODIUM 100 MG PO CAPS
100.0000 mg | ORAL_CAPSULE | Freq: Two times a day (BID) | ORAL | Status: DC
  Administered 2014-07-20 – 2014-07-21 (×2): 100 mg via ORAL
  Filled 2014-07-20 (×2): qty 1

## 2014-07-20 MED ORDER — FAMOTIDINE 20 MG PO TABS
ORAL_TABLET | ORAL | Status: AC
  Administered 2014-07-20: 20 mg via ORAL
  Filled 2014-07-20: qty 1

## 2014-07-20 MED ORDER — ACETAMINOPHEN 325 MG PO TABS: 650.0000 mg | ORAL_TABLET | ORAL | Status: DC | PRN

## 2014-07-20 MED ORDER — ROCURONIUM BROMIDE 100 MG/10ML IV SOLN
INTRAVENOUS | Status: DC | PRN
  Administered 2014-07-20: 10 mg via INTRAVENOUS
  Administered 2014-07-20: 50 mg via INTRAVENOUS
  Administered 2014-07-20 (×2): 10 mg via INTRAVENOUS
  Administered 2014-07-20: 20 mg via INTRAVENOUS
  Administered 2014-07-20: 10 mg via INTRAVENOUS

## 2014-07-20 MED ORDER — OXYCODONE-ACETAMINOPHEN 5-325 MG PO TABS
1.0000 | ORAL_TABLET | ORAL | Status: DC | PRN
  Administered 2014-07-21 (×2): 1 via ORAL
  Filled 2014-07-20: qty 1
  Filled 2014-07-20: qty 2

## 2014-07-20 MED ORDER — ACETAMINOPHEN 10 MG/ML IV SOLN
INTRAVENOUS | Status: DC | PRN
  Administered 2014-07-20: 1000 mg via INTRAVENOUS

## 2014-07-20 MED ORDER — LACTATED RINGERS IV SOLN
INTRAVENOUS | Status: DC
  Administered 2014-07-20 – 2014-07-21 (×2): via INTRAVENOUS

## 2014-07-20 MED ORDER — FENTANYL CITRATE (PF) 100 MCG/2ML IJ SOLN
25.0000 ug | INTRAMUSCULAR | Status: DC | PRN
  Administered 2014-07-20 (×4): 25 ug via INTRAVENOUS

## 2014-07-20 MED ORDER — LIDOCAINE HCL (CARDIAC) 20 MG/ML IV SOLN
INTRAVENOUS | Status: DC | PRN
  Administered 2014-07-20: 50 mg via INTRAVENOUS

## 2014-07-20 MED ORDER — LACTATED RINGERS IV SOLN
INTRAVENOUS | Status: DC
  Administered 2014-07-20 (×2): via INTRAVENOUS

## 2014-07-20 MED ORDER — DEXAMETHASONE SODIUM PHOSPHATE 4 MG/ML IJ SOLN
INTRAMUSCULAR | Status: DC | PRN
  Administered 2014-07-20: 8 mg via INTRAVENOUS

## 2014-07-20 MED ORDER — KETOROLAC TROMETHAMINE 30 MG/ML IJ SOLN
30.0000 mg | Freq: Four times a day (QID) | INTRAMUSCULAR | Status: AC
  Administered 2014-07-20 – 2014-07-21 (×4): 30 mg via INTRAVENOUS
  Filled 2014-07-20 (×4): qty 1

## 2014-07-20 MED ORDER — FENTANYL CITRATE (PF) 100 MCG/2ML IJ SOLN
INTRAMUSCULAR | Status: DC | PRN
  Administered 2014-07-20 (×7): 50 ug via INTRAVENOUS

## 2014-07-20 MED ORDER — CEFOXITIN SODIUM-DEXTROSE 2-2.2 GM-% IV SOLR (PREMIX): 2.0000 g | Freq: Once | INTRAVENOUS | Status: DC

## 2014-07-20 MED ORDER — FAMOTIDINE 20 MG PO TABS
20.0000 mg | ORAL_TABLET | Freq: Once | ORAL | Status: AC
  Administered 2014-07-20: 20 mg via ORAL

## 2014-07-20 MED ORDER — ONDANSETRON HCL 4 MG PO TABS: 4.0000 mg | ORAL_TABLET | Freq: Four times a day (QID) | ORAL | Status: DC | PRN

## 2014-07-20 MED ORDER — GLYCOPYRROLATE 0.2 MG/ML IJ SOLN
INTRAMUSCULAR | Status: DC | PRN
  Administered 2014-07-20: 0.6 mg via INTRAVENOUS

## 2014-07-20 MED ORDER — ONDANSETRON HCL 4 MG/2ML IJ SOLN: 4.0000 mg | Freq: Once | INTRAMUSCULAR | Status: DC | PRN

## 2014-07-20 MED ORDER — NEOSTIGMINE METHYLSULFATE 10 MG/10ML IV SOLN
INTRAVENOUS | Status: DC | PRN
  Administered 2014-07-20: 3 mg via INTRAVENOUS

## 2014-07-20 MED ORDER — BUPIVACAINE HCL (PF) 0.5 % IJ SOLN
INTRAMUSCULAR | Status: AC
  Filled 2014-07-20: qty 30

## 2014-07-20 MED ORDER — MORPHINE SULFATE 2 MG/ML IJ SOLN
1.0000 mg | INTRAMUSCULAR | Status: DC | PRN
  Administered 2014-07-20 – 2014-07-21 (×4): 2 mg via INTRAVENOUS
  Filled 2014-07-20 (×4): qty 1

## 2014-07-20 MED ORDER — MIDAZOLAM HCL 2 MG/2ML IJ SOLN
INTRAMUSCULAR | Status: DC | PRN
  Administered 2014-07-20 (×2): 2 mg via INTRAVENOUS

## 2014-07-20 MED ORDER — ONDANSETRON HCL 4 MG/2ML IJ SOLN: 4.0000 mg | Freq: Four times a day (QID) | INTRAMUSCULAR | Status: DC | PRN

## 2014-07-20 MED ORDER — ACETAMINOPHEN 10 MG/ML IV SOLN
INTRAVENOUS | Status: AC
  Filled 2014-07-20: qty 100

## 2014-07-20 MED ORDER — PROPOFOL 10 MG/ML IV BOLUS
INTRAVENOUS | Status: DC | PRN
  Administered 2014-07-20: 200 mg via INTRAVENOUS

## 2014-07-20 SURGICAL SUPPLY — 44 items
BAG URO DRAIN 2000ML W/SPOUT (MISCELLANEOUS) ×6 IMPLANT
BLADE SURG SZ11 CARB STEEL (BLADE) ×6 IMPLANT
CANISTER SUCT 1200ML W/VALVE (MISCELLANEOUS) ×3 IMPLANT
CATH FOLEY 2WAY  5CC 16FR (CATHETERS) ×1
CATH URTH 16FR FL 2W BLN LF (CATHETERS) ×2 IMPLANT
CHLORAPREP W/TINT 26ML (MISCELLANEOUS) ×3 IMPLANT
DEFOGGER SCOPE WARMER CLEARIFY (MISCELLANEOUS) ×3 IMPLANT
DEVICE SUTURE ENDOST 10MM (ENDOMECHANICALS) IMPLANT
DRSG TEGADERM 2-3/8X2-3/4 SM (GAUZE/BANDAGES/DRESSINGS) IMPLANT
ENDOSTITCH 0 SINGLE 48 (SUTURE) IMPLANT
GAUZE SPONGE NON-WVN 2X2 STRL (MISCELLANEOUS) IMPLANT
GLOVE BIO SURGEON STRL SZ8 (GLOVE) ×15 IMPLANT
GLOVE INDICATOR 8.0 STRL GRN (GLOVE) ×6 IMPLANT
GOWN STRL REUS W/ TWL LRG LVL3 (GOWN DISPOSABLE) ×2 IMPLANT
GOWN STRL REUS W/ TWL XL LVL3 (GOWN DISPOSABLE) ×6 IMPLANT
GOWN STRL REUS W/TWL LRG LVL3 (GOWN DISPOSABLE) ×1
GOWN STRL REUS W/TWL XL LVL3 (GOWN DISPOSABLE) ×3
IRRIGATION STRYKERFLOW (MISCELLANEOUS) ×2 IMPLANT
IRRIGATOR STRYKERFLOW (MISCELLANEOUS) ×3
IV LACTATED RINGERS 1000ML (IV SOLUTION) ×3 IMPLANT
LABEL OR SOLS (LABEL) ×3 IMPLANT
LIQUID BAND (GAUZE/BANDAGES/DRESSINGS) ×3 IMPLANT
MANIPULATOR VCARE LG CRV RETR (MISCELLANEOUS) IMPLANT
MANIPULATOR VCARE STD CRV RETR (MISCELLANEOUS) ×3 IMPLANT
NEEDLE VERESS 14GA 120MM (NEEDLE) ×3 IMPLANT
NS IRRIG 500ML POUR BTL (IV SOLUTION) ×3 IMPLANT
OCCLUDER COLPOPNEUMO (BALLOONS) ×3 IMPLANT
PACK GYN LAPAROSCOPIC (MISCELLANEOUS) ×3 IMPLANT
PAD OB MATERNITY 4.3X12.25 (PERSONAL CARE ITEMS) ×3 IMPLANT
PAD PREP 24X41 OB/GYN DISP (PERSONAL CARE ITEMS) ×3 IMPLANT
SCISSORS METZENBAUM CVD 33 (INSTRUMENTS) IMPLANT
SET CYSTO W/LG BORE CLAMP LF (SET/KITS/TRAYS/PACK) ×3 IMPLANT
SHEARS HARMONIC ACE PLUS 36CM (ENDOMECHANICALS) ×3 IMPLANT
SLEEVE ENDOPATH XCEL 5M (ENDOMECHANICALS) ×3 IMPLANT
SPONGE LAP 18X18 5 PK (GAUZE/BANDAGES/DRESSINGS) ×6 IMPLANT
SPONGE VERSALON 2X2 STRL (MISCELLANEOUS)
STRAP SAFETY BODY (MISCELLANEOUS) ×3 IMPLANT
SUT VIC AB 2-0 UR6 27 (SUTURE) IMPLANT
SYR 50ML LL SCALE MARK (SYRINGE) ×3 IMPLANT
SYRINGE 10CC LL (SYRINGE) ×3 IMPLANT
TROCAR ENDO BLADELESS 11MM (ENDOMECHANICALS) ×3 IMPLANT
TROCAR XCEL NON-BLD 5MMX100MML (ENDOMECHANICALS) ×3 IMPLANT
TROCAR XCEL UNIV SLVE 11M 100M (ENDOMECHANICALS) ×3 IMPLANT
TUBING INSUFFLATOR HEATED (MISCELLANEOUS) ×3 IMPLANT

## 2014-07-20 NOTE — Transfer of Care (Signed)
Immediate Anesthesia Transfer of Care Note  Patient: Jasmine Tucker  Procedure(s) Performed: Procedure(s): HYSTERECTOMY TOTAL LAPAROSCOPIC (N/A) LAPAROSCOPIC BILATERAL SALPINGECTOMY (Bilateral) LAPAROSCOPIC OOPHERECTOMY-(POSSIBLE) (Bilateral)  Patient Location: PACU  Anesthesia Type:General  Level of Consciousness: awake and alert   Airway & Oxygen Therapy: Patient Spontanous Breathing and Patient connected to face mask oxygen  Post-op Assessment: Report given to RN and Post -op Vital signs reviewed and stable  Post vital signs: Reviewed and stable  Last Vitals:  Filed Vitals:   07/20/14 0627  BP: 103/65  Pulse: 71  Temp: 36.9 C  Resp: 14    Complications: No apparent anesthesia complications

## 2014-07-20 NOTE — Anesthesia Procedure Notes (Signed)
Procedure Name: Intubation Date/Time: 07/20/2014 8:04 AM Performed by: Christy Sartorius Pre-anesthesia Checklist: Patient identified, Emergency Drugs available, Suction available, Patient being monitored and Timeout performed Patient Re-evaluated:Patient Re-evaluated prior to inductionOxygen Delivery Method: Circle system utilized Preoxygenation: Pre-oxygenation with 100% oxygen Intubation Type: IV induction Ventilation: Mask ventilation without difficulty Laryngoscope Size: 3 Grade View: Grade II Tube type: Oral Tube size: 7.0 mm Number of attempts: 1 Airway Equipment and Method: Stylet Placement Confirmation: ETT inserted through vocal cords under direct vision,  positive ETCO2 and breath sounds checked- equal and bilateral Secured at: 20 cm Tube secured with: Tape Dental Injury: Teeth and Oropharynx as per pre-operative assessment  Comments: Gauze bite block

## 2014-07-20 NOTE — Anesthesia Postprocedure Evaluation (Signed)
  Anesthesia Post-op Note  Patient: Jasmine Tucker  Procedure(s) Performed: Procedure(s): HYSTERECTOMY TOTAL LAPAROSCOPIC (N/A) LAPAROSCOPIC BILATERAL SALPINGECTOMY (Bilateral) LAPAROSCOPIC OOPHERECTOMY-(POSSIBLE) (Bilateral)  Anesthesia type:General ETT  Patient location: PACU  Post pain: Pain level controlled  Post assessment: Post-op Vital signs reviewed, Patient's Cardiovascular Status Stable, Respiratory Function Stable, Patent Airway and No signs of Nausea or vomiting  Post vital signs: Reviewed and stable  Last Vitals:  Filed Vitals:   07/20/14 1100  BP: 118/71  Pulse: 65  Temp: 36.6 C  Resp: 12    Level of consciousness: awake, alert  and patient cooperative  Complications: No apparent anesthesia complications

## 2014-07-20 NOTE — Anesthesia Preprocedure Evaluation (Signed)
Anesthesia Evaluation  Patient identified by MRN, date of birth, ID band Patient awake    Reviewed: Allergy & Precautions, H&P , NPO status , Patient's Chart, lab work & pertinent test results, reviewed documented beta blocker date and time   Airway Mallampati: II  TM Distance: >3 FB Neck ROM: full    Dental   Pulmonary Current Smoker,          Cardiovascular Rate:Normal     Neuro/Psych    GI/Hepatic GERD-  ,  Endo/Other    Renal/GU      Musculoskeletal   Abdominal   Peds  Hematology  (+) anemia ,   Anesthesia Other Findings   Reproductive/Obstetrics                             Anesthesia Physical Anesthesia Plan  ASA: II  Anesthesia Plan: General ETT   Post-op Pain Management:    Induction:   Airway Management Planned:   Additional Equipment:   Intra-op Plan:   Post-operative Plan:   Informed Consent: I have reviewed the patients History and Physical, chart, labs and discussed the procedure including the risks, benefits and alternatives for the proposed anesthesia with the patient or authorized representative who has indicated his/her understanding and acceptance.     Plan Discussed with: CRNA  Anesthesia Plan Comments:         Anesthesia Quick Evaluation

## 2014-07-20 NOTE — Op Note (Signed)
Operative Report:  PRE-OP DIAGNOSIS: FIBROID UTERUS,MENOMETRORRHAGIA AND ANEMIA   POST-OP DIAGNOSIS: FIBROID UTERUS,MENOMETRORRHAGIA AND ANEMIA   PROCEDURE: Procedure(s): HYSTERECTOMY TOTAL LAPAROSCOPIC LAPAROSCOPIC BILATERAL SALPINGECTOMY LAPAROSCOPIC OOPHERECTOMY CYSTOSCOPY  SURGEON: Barnett Applebaum, MD, FACOG  ASSISTANT: Marisue Brooklyn, MD    ANESTHESIA: General endotracheal anesthesia  ESTIMATED BLOOD LOSS: less than 50   SPECIMENS: Uterus, Tubes, Ovaries.  COMPLICATIONS: None  DISPOSITION: stable to PACU  FINDINGS: Intraabdominal adhesions were noted. MultiFibroid Uterus, enlarged entire uterus, adhesions in abd and also in pelvis especially around left adnexa.  Cystoscopy normal w ureters bilaterally patent.  PROCEDURE:  The patient was taken to the OR where anesthesia was administed. She was prepped and draped in the normal sterile fashion in the dorsal lithotomy position in the Regina stirrups. A time out was performed. A Graves speculum was inserted, the cervix was grasped with a single tooth tenaculum and the endometrial cavity was sounded. The cervix was progressively dilated to a size 18 [place] with [employer] dilators. A V-Care uterine manipulator was inserted in the usual fashion without incident. Gloves were changed and attention was turned to the abdomen.   An infraumbilical transverse 11 mm skin incision was made with the scalpel after local anesthesia applied to the skin. A Veress-step needle was inserted in the usual fashion and confirmed using the hanging drop technique. A pneumoperitoneum was obtained by insufflation of CO2 (opening pressure of 24mmHg) to 74mmHg. A diagnostic laparoscopy was performed yielding the previously described findings.  Palmers point 5 mm incision and trocar placed under direct visualization, to better view operative field.  Lysis of adhesions performed w 5 mm Harmonic Scapel.   Attention was turned to the left lower quadrant where after visualization  of the inferior epigastric vessels a 43mm skin incision was made with the scalpel. A 5 mm laparoscopic port was inserted. The same procedure was repeated in the right lower quadrant with a 65mm trocar. Attention was turned to the left aspect of the uterus, where after visualization of the ureter, the round ligament was coagulated and transected using the 63mm Harmonic Scapel. The anterior and posterior leafs of the broad ligament were dissected off as the anterior one was coagulated and transected in a caudal direction towards the cuff of the uterine manipulator.  Attention was then turned to the left fallopian tube and ovary which was recognized by visualization of the fimbria. The infundibulopelvic ligament and its blood vessels were carefully coagulated and transected using the Harmonic scapel.  Attention was turned to the right aspect of the uterus where the same procedure was performed.  The vesicouterine reflection of the peritoneum was dissected with the harmonic scapel and the bladder flap was created bluntly.  The uterine vessels were coagulated and transected bilaterally using first bipolar cautery and then the harmonic scapel. A 360 degree, circumferential colpotomy was done to completely amputate the uterus with cervix and tubes. Once the specimen was amputated it was delivered through the vagina.   The colpotomy was repaired in a simple interrupted fashion using a 0-Polysorb suture with an endo-stitch device.  Vaginal exam confirms complete closure.  The cavity was copiously irrigated. A survey of the pelvic cavity revealed adequate hemostasis and no injury to bowel, bladder, or ureter.   A diagnostic cystoscopy was performed using saline distension of bladder with no lesions or injuries noted.  Bilateral urine flow from each ureteral orifice is visualized.  At this point the procedure was finalized.  Rectus fascia was closed w 0 Vicryl suture using the  fascia closure device at the RLQ and Umb  incisions.  All the instruments were removed from the patient's body. Gas was expelled and patient is leveled.  Incisions are closed with 4-0 vicryl and then skin adhesive.    Patient goes to recovery room in stable condition.  All sponge, instrument, and needle counts are correct x2.

## 2014-07-20 NOTE — Progress Notes (Signed)
Day of Surgery Procedure(s) (LRB): HYSTERECTOMY TOTAL LAPAROSCOPIC (N/A) LAPAROSCOPIC BILATERAL SALPINGECTOMY (Bilateral) LAPAROSCOPIC OOPHERECTOMY-(POSSIBLE) (Bilateral)  Subjective: Patient reports incisional pain.    Objective: I have reviewed patient's vital signs, intake and output and medications.  General: alert and cooperative GI: normal findings: symmetric and incision: clean, dry and intact Extremities: extremities normal, atraumatic, no cyanosis or edema and Homans sign is negative, no sign of DVT  Assessment: s/p Procedure(s): HYSTERECTOMY TOTAL LAPAROSCOPIC (N/A) LAPAROSCOPIC BILATERAL SALPINGECTOMY (Bilateral) LAPAROSCOPIC OOPHERECTOMY-(POSSIBLE) (Bilateral): stable  Plan: Advance diet Advance to PO medication  LOS: 0 days    Jasmine Tucker PAUL 07/20/2014, 2:53 PM

## 2014-07-20 NOTE — H&P (Signed)
History and Physical Interval Note:  07/20/2014 7:11 AM  Jasmine Tucker  has presented today for surgery, with the diagnosis of [place]  The various methods of treatment have been discussed with the patient and family. After consideration of risks, benefits and other options for treatment, the patient has consented to  Procedure(s): HYSTERECTOMY TOTAL LAPAROSCOPIC (N/A) LAPAROSCOPIC BILATERAL SALPINGECTOMY (Bilateral)  as a surgical intervention .  The patient's history has been reviewed, patient examined, no change in status, stable for surgery.  Pt has the following beta blocker history-  Not taking Beta Blocker.  I have reviewed the patient's chart and labs.  Questions were answered to the patient's satisfaction.       HARRIS,ROBERT Eddie Dibbles

## 2014-07-21 ENCOUNTER — Encounter: Payer: Self-pay | Admitting: Obstetrics & Gynecology

## 2014-07-21 DIAGNOSIS — D259 Leiomyoma of uterus, unspecified: Secondary | ICD-10-CM | POA: Diagnosis not present

## 2014-07-21 LAB — HEMOGLOBIN: Hemoglobin: 9.9 g/dL — ABNORMAL LOW (ref 12.0–16.0)

## 2014-07-21 LAB — SURGICAL PATHOLOGY

## 2014-07-21 MED ORDER — OXYCODONE-ACETAMINOPHEN 5-325 MG PO TABS: 1.0000 | ORAL_TABLET | ORAL | Status: DC | PRN

## 2014-07-21 MED ORDER — FAMOTIDINE 20 MG PO TABS
20.0000 mg | ORAL_TABLET | Freq: Every day | ORAL | Status: DC
  Administered 2014-07-21: 20 mg via ORAL
  Filled 2014-07-21: qty 1

## 2014-07-21 MED ORDER — CALCIUM CARBONATE ANTACID 500 MG PO CHEW: 1.0000 | CHEWABLE_TABLET | Freq: Three times a day (TID) | ORAL | Status: DC | PRN

## 2014-07-21 MED ORDER — DOCUSATE SODIUM 100 MG PO CAPS: 100.0000 mg | ORAL_CAPSULE | Freq: Two times a day (BID) | ORAL | Status: DC

## 2014-07-21 NOTE — Discharge Instructions (Signed)
General Gynecological Post-Operative Instructions ?You may expect to feel dizzy, weak, and drowsy for as long as 24 hours after receiving the medicine that made you sleep (anesthetic).  ?Do not drive a car, ride a bicycle, participate in physical activities, or take public transportation until you are done taking narcotic pain medicines or as directed by your doctor.  ?Do not drink alcohol or take tranquilizers.  ?Do not take medicine that has not been prescribed by your doctor.  ?Do not sign important papers or make important decisions while on narcotic pain medicines.  ?Have a responsible person with you.  ?CARE OF INCISION  ?Keep incision clean and dry. ?Take showers instead of baths until your doctor gives you permission to take baths.  ?Avoid heavy lifting (more than 10 pounds/4.5 kilograms), pushing, or pulling.  ?Avoid activities that may risk injury to your surgical site.  ?No sexual intercourse or placement of anything in the vagina for 6 weeks or as instructed by your doctor. ?If you have tubes coming from the wound site, check with your doctor regarding appropriate care of the tubes. ?Only take prescription or over-the-counter medicines  for pain, discomfort, or fever as directed by your doctor. Do not take aspirin. It can make you bleed. Take medicines (antibiotics) that kill germs if they are prescribed for you.  ?Call the office or go to the ER if:  ?You feel sick to your stomach (nauseous) and you start to throw up (vomit).  ?You have trouble eating or drinking.  ?You have an oral temperature above 101.  ?You have constipation that is not helped by adjusting diet or increasing fluid intake. Pain medicines are a common cause of constipation.  ?You have any other concerns. ?SEEK IMMEDIATE MEDICAL CARE IF:  ?You have persistent dizziness.  ?You have difficulty breathing or a congested sounding (croupy) cough.  ?You have an oral temperature above 102.5, not controlled by medicine.  ?There is increasing  pain or tenderness near or in the surgical site.  ? ? ?

## 2014-07-21 NOTE — Progress Notes (Signed)
VSS. Pt has voided. Discharge ordered by Dr. Kenton Kingfisher. Instructions reviewed. Pt v/u of all instructions. Prescriptions given to pt. Escorted by auxillary via w/c in stable condition.

## 2014-07-21 NOTE — Discharge Summary (Signed)
Physician Discharge Summary  Patient ID: Jasmine Tucker MRN: [id number] DOB/AGE: 1970/11/04 44 y.o.  Admit date: 07/20/2014 Discharge date: 07/21/2014  Admission Diagnoses:  Discharge Diagnoses:  Active Problems:   Fibroids   Discharged Condition: good  Hospital Course: Surgery and recovery, no complications.  Consults: None  Significant Diagnostic Studies: none  Treatments: surgery: Total Laparoscopic Hysterectomy  Discharge Exam: Blood pressure 101/71, pulse 58, temperature 98.3 F (36.8 C), temperature source Oral, resp. rate 18, SpO2 98 %. General appearance: alert and cooperative GI: soft, non-tender; bowel sounds normal; no masses,  no organomegaly Extremities: Homans sign is negative, no sign of DVT Incision/Wound:healing well  Disposition: 01-Home or Self Care     Medication List    TAKE these medications        ALPRAZolam 0.5 MG tablet  Commonly known as:  XANAX  Take 0.5 mg by mouth 2 (two) times daily as needed for anxiety.     docusate sodium 100 MG capsule  Commonly known as:  COLACE  Take 1 capsule (100 mg total) by mouth 2 (two) times daily.     ferrous sulfate 325 (65 FE) MG tablet  Take 325 mg by mouth 2 (two) times daily with a meal.     oxyCODONE-acetaminophen 5-325 MG per tablet  Commonly known as:  PERCOCET/ROXICET  Take 1-2 tablets by mouth every 4 (four) hours as needed (moderate to severe pain (when tolerating fluids)).           Follow-up Information    Follow up with Hoyt Koch, MD In 2 weeks.   Specialty:  Obstetrics and Gynecology   Why:  As Scheduled   Contact information:   [address] [place] [address] [phone]       Signed: Hoyt Koch 07/21/2014, 7:38 AM

## 2014-07-22 ENCOUNTER — Ambulatory Visit: Payer: Self-pay | Admitting: Podiatry

## 2014-07-23 LAB — ABO/RH: ABO/RH(D): O POS

## 2014-08-09 ENCOUNTER — Ambulatory Visit (INDEPENDENT_AMBULATORY_CARE_PROVIDER_SITE_OTHER): Payer: Managed Care, Other (non HMO)

## 2014-08-09 ENCOUNTER — Encounter: Payer: Self-pay | Admitting: Podiatry

## 2014-08-09 ENCOUNTER — Ambulatory Visit (INDEPENDENT_AMBULATORY_CARE_PROVIDER_SITE_OTHER): Payer: Managed Care, Other (non HMO) | Admitting: Podiatry

## 2014-08-09 DIAGNOSIS — L923 Foreign body granuloma of the skin and subcutaneous tissue: Secondary | ICD-10-CM | POA: Diagnosis not present

## 2014-08-09 DIAGNOSIS — M795 Residual foreign body in soft tissue: Secondary | ICD-10-CM

## 2014-08-09 DIAGNOSIS — M6020 Foreign body granuloma of soft tissue, not elsewhere classified, unspecified site: Secondary | ICD-10-CM

## 2014-08-09 NOTE — Progress Notes (Signed)
   Subjective:    Patient ID: Jasmine Tucker, female    DOB: 03/19/1970, 44 y.o.   MRN: [id number]  HPI The right foot , stepped on a broken ceramic plate and i think it is in my foot, i thought i had got it all out , it is real sore and tender to the touch    Review of Systems     Objective:   Physical Exam : I have reviewed her past medical history medications allergy surgery social history and review of systems. Pulses are strongly palpable. Neurologic sensorium right foot intact. Orthopedic evaluation and x-rays right foot pes planus. Cutaneous evaluation demonstrates mild area of erythema and a small area of bleeding into the lateral aspect of the right foot just proximal to the fifth metatarsal base right. Radiograph confirms what appears to be a radiopaque foreign body beneath a skin marker. I sees no signs of gas or major infection and skin.        Assessment & Plan:  Assessment : foreign body right foot.  Plan: injected the area today with local anesthesia beneath the lesion. Clean the skin with Betadine. Very carefully removed several small layers of skin into without a portion of a ceramic plate measuring approximately 2 mm x 3 mm. This was removed in total and flushed. A dry sterile compressive dressing was applied. She will follow up with me in 1 week if necessary. She will watch for signs and symptoms of infection. These are explained to her in detail.

## 2014-10-18 ENCOUNTER — Encounter: Payer: Self-pay | Admitting: Unknown Physician Specialty

## 2014-10-18 ENCOUNTER — Ambulatory Visit (INDEPENDENT_AMBULATORY_CARE_PROVIDER_SITE_OTHER): Payer: Managed Care, Other (non HMO) | Admitting: Unknown Physician Specialty

## 2014-10-18 ENCOUNTER — Ambulatory Visit: Payer: Self-pay | Admitting: Unknown Physician Specialty

## 2014-10-18 VITALS — BP 116/83 | HR 62 | Temp 97.8°F | Ht 64.0 in | Wt 262.2 lb

## 2014-10-18 DIAGNOSIS — R5382 Chronic fatigue, unspecified: Secondary | ICD-10-CM | POA: Diagnosis not present

## 2014-10-18 DIAGNOSIS — M791 Myalgia: Secondary | ICD-10-CM | POA: Diagnosis not present

## 2014-10-18 DIAGNOSIS — M609 Myositis, unspecified: Secondary | ICD-10-CM | POA: Diagnosis not present

## 2014-10-18 DIAGNOSIS — F419 Anxiety disorder, unspecified: Secondary | ICD-10-CM | POA: Diagnosis not present

## 2014-10-18 DIAGNOSIS — IMO0001 Reserved for inherently not codable concepts without codable children: Secondary | ICD-10-CM

## 2014-10-18 MED ORDER — ALPRAZOLAM 0.5 MG PO TABS: 0.5000 mg | ORAL_TABLET | Freq: Every evening | ORAL | Status: DC | PRN

## 2014-10-18 NOTE — Assessment & Plan Note (Addendum)
Long discussion with patient about her anxiety and alternative treatments including exercise, SSIR's and counseling.  I will give her Xanax .5 mg #30 with a refill should last 6 months.  Will refer to psychiatry as patient is frustrated with her care here.

## 2014-10-18 NOTE — Progress Notes (Signed)
BP 116/83 mmHg  Pulse 62  Temp(Src) 97.8 F (36.6 C)  Ht 5\' 4"  (1.626 m)  Wt 262 lb 3.2 oz (118.933 kg)  BMI 44.98 kg/m2  SpO2 96%  LMP  (LMP Unknown)   Subjective:    Patient ID: Jasmine Tucker, female    DOB: 01/19/1971, 44 y.o.   MRN: [id number]  HPI: Jasmine Tucker is a 44 y.o. female  Chief Complaint  Patient presents with  . Medication Refill    pt states she needs a refill on xanax    Relevant past medical, surgical, family and social history reviewed and updated as indicated. Interim medical history since our last visit reviewed. Allergies and medications reviewed and updated.  Anxiety Pt would like a refill of Alprazolam.  She takes it 2-3 times/week and has been out of it for quite a period of time.  She had a Hysterectomy and was given 3-6  Pills.  States she has been suffering and upset today because she feels she is being made to think she shouldn't have it.  States she needs it when she can't "self talk" herself down.  She states she starts sweating, her heart beats fast.  If she has the medicine, it won't let her go "all the way up."  She has had anxiety problems for a couple of years since she went to the hospital.  She has a master's degreee in counseling and doesn't feel like she needs counseling at this time.  She states she tried [employer] a long time ago and they didn't work.     Anemia  Pt states she had a hysterectomy for a hgb of 3.7.  She is also tired and aches all over  Review of Systems  Constitutional: Negative.   HENT: Negative.   Respiratory: Negative.   Cardiovascular: Positive for palpitations.  Musculoskeletal: Negative.        States she is achy in her joints.  Primarily her knees but often achey all over.  States this happened after her surgery.    Skin: Negative.   Psychiatric/Behavioral: Positive for agitation.    Per HPI unless specifically indicated above     Objective:    BP 116/83 mmHg  Pulse 62  Temp(Src) 97.8 F (36.6  C)  Ht 5\' 4"  (1.626 m)  Wt 262 lb 3.2 oz (118.933 kg)  BMI 44.98 kg/m2  SpO2 96%  LMP  (LMP Unknown)  Wt Readings from Last 3 Encounters:  10/18/14 262 lb 3.2 oz (118.933 kg)  08/09/14 230 lb (104.327 kg)  07/14/14 248 lb (112.492 kg)    Physical Exam  Constitutional: She is oriented to person, place, and time. She appears well-developed and well-nourished. No distress.  HENT:  Head: Normocephalic and atraumatic.  Eyes: Conjunctivae and lids are normal. Right eye exhibits no discharge. Left eye exhibits no discharge. No scleral icterus.  Cardiovascular: Normal rate, regular rhythm and normal heart sounds.   Pulmonary/Chest: Effort normal and breath sounds normal. No respiratory distress.  Abdominal: Normal appearance. There is no splenomegaly or hepatomegaly.  Musculoskeletal: Normal range of motion.  Neurological: She is alert and oriented to person, place, and time.  Skin: Skin is intact. No rash noted. No pallor.  Psychiatric: She has a normal mood and affect. Her behavior is normal. Judgment and thought content normal.      Assessment & Plan:   Problem List Items Addressed This Visit      Unprioritized   Chronic anxiety - Primary  Long discussion with patient about her anxiety and alternative treatments including exercise, SSIR's and counseling.  I will give her Xanax .5 mg #30 with a refill should last 6 months.  Will refer to psychiatry as patient is frustrated with her care here.          Relevant Medications   ALPRAZolam (XANAX) 0.5 MG tablet       Follow up plan: Return if symptoms worsen or fail to improve, for Referral to psychiatry.

## 2014-10-19 ENCOUNTER — Encounter: Payer: Self-pay | Admitting: Unknown Physician Specialty

## 2014-10-19 LAB — COMPREHENSIVE METABOLIC PANEL
ALT: 21 IU/L (ref 0–32)
AST: 23 IU/L (ref 0–40)
Albumin/Globulin Ratio: 1.3 (ref 1.1–2.5)
Albumin: 3.9 g/dL (ref 3.5–5.5)
Alkaline Phosphatase: 71 IU/L (ref 39–117)
BUN/Creatinine Ratio: 18 (ref 9–23)
BUN: 8 mg/dL (ref 6–24)
Bilirubin Total: 0.4 mg/dL (ref 0.0–1.2)
CALCIUM: 9.2 mg/dL (ref 8.7–10.2)
CO2: 26 mmol/L (ref 18–29)
CREATININE: 0.44 mg/dL — AB (ref 0.57–1.00)
Chloride: 102 mmol/L (ref 97–108)
GFR calc Af Amer: 140 mL/min/{1.73_m2} (ref >59)
GFR, EST NON AFRICAN AMERICAN: 121 mL/min/{1.73_m2} (ref >59)
Globulin, Total: 2.9 g/dL (ref 1.5–4.5)
Glucose: 87 mg/dL (ref 65–99)
Potassium: 4.6 mmol/L (ref 3.5–5.2)
Sodium: 144 mmol/L (ref 134–144)
TOTAL PROTEIN: 6.8 g/dL (ref 6.0–8.5)

## 2014-10-19 LAB — CBC WITH DIFFERENTIAL/PLATELET
BASOS: 1 %
Basophils Absolute: 0.1 10*3/uL (ref 0.0–0.2)
EOS (ABSOLUTE): 0.2 10*3/uL (ref 0.0–0.4)
Eos: 5 %
Hematocrit: 37.4 % (ref 34.0–46.6)
Hemoglobin: 11.4 g/dL (ref 11.1–15.9)
IMMATURE GRANS (ABS): 0 10*3/uL (ref 0.0–0.1)
IMMATURE GRANULOCYTES: 0 %
LYMPHS: 37 %
Lymphocytes Absolute: 1.6 10*3/uL (ref 0.7–3.1)
MCH: 24.2 pg — ABNORMAL LOW (ref 26.6–33.0)
MCHC: 30.5 g/dL — ABNORMAL LOW (ref 31.5–35.7)
MCV: 79 fL (ref 79–97)
Monocytes Absolute: 0.3 10*3/uL (ref 0.1–0.9)
Monocytes: 7 %
NEUTROS PCT: 50 %
Neutrophils Absolute: 2.1 10*3/uL (ref 1.4–7.0)
PLATELETS: 244 10*3/uL (ref 150–379)
RBC: 4.71 x10E6/uL (ref 3.77–5.28)
RDW: 15.9 % — ABNORMAL HIGH (ref 12.3–15.4)
WBC: 4.3 10*3/uL (ref 3.4–10.8)

## 2014-10-19 LAB — VITAMIN D 25 HYDROXY (VIT D DEFICIENCY, FRACTURES): Vit D, 25-Hydroxy: 7 ng/mL — ABNORMAL LOW (ref 30.0–100.0)

## 2014-10-19 LAB — TSH: TSH: 0.576 u[IU]/mL (ref 0.450–4.500)

## 2015-03-06 DIAGNOSIS — M722 Plantar fascial fibromatosis: Secondary | ICD-10-CM

## 2015-03-06 HISTORY — DX: Plantar fascial fibromatosis: M72.2

## 2015-03-18 ENCOUNTER — Encounter: Payer: Self-pay | Admitting: Family Medicine

## 2015-03-18 ENCOUNTER — Ambulatory Visit (INDEPENDENT_AMBULATORY_CARE_PROVIDER_SITE_OTHER): Payer: Managed Care, Other (non HMO) | Admitting: Family Medicine

## 2015-03-18 VITALS — BP 129/84 | HR 76 | Temp 98.2°F | Ht 63.5 in | Wt 284.0 lb

## 2015-03-18 DIAGNOSIS — L732 Hidradenitis suppurativa: Secondary | ICD-10-CM

## 2015-03-18 DIAGNOSIS — F419 Anxiety disorder, unspecified: Secondary | ICD-10-CM

## 2015-03-18 DIAGNOSIS — B009 Herpesviral infection, unspecified: Secondary | ICD-10-CM

## 2015-03-18 MED ORDER — ALPRAZOLAM 0.5 MG PO TABS: 0.5000 mg | ORAL_TABLET | Freq: Four times a day (QID) | ORAL | Status: DC | PRN

## 2015-03-18 MED ORDER — SULFAMETHOXAZOLE-TRIMETHOPRIM 800-160 MG PO TABS: 1.0000 | ORAL_TABLET | Freq: Two times a day (BID) | ORAL | Status: DC

## 2015-03-18 MED ORDER — ECONAZOLE NITRATE 1 % EX CREA: TOPICAL_CREAM | Freq: Every day | CUTANEOUS | Status: DC
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
## 2015-03-18 NOTE — Patient Instructions (Signed)
Please do eat yogurt daily or take a probiotic daily for the next month or two We want to replace the healthy germs in the gut If you notice foul, watery diarrhea in the next two months, schedule an appointment RIGHT AWAY Start the new antibiotics Use the anxiety medicine as needed Return in 3 months, but please do call sooner if needed

## 2015-03-30 DIAGNOSIS — B009 Herpesviral infection, unspecified: Secondary | ICD-10-CM | POA: Insufficient documentation

## 2015-03-30 DIAGNOSIS — L732 Hidradenitis suppurativa: Secondary | ICD-10-CM | POA: Insufficient documentation

## 2015-03-30 NOTE — Assessment & Plan Note (Addendum)
Explained diagnosis; this is new information for her; she was quite relieved to hear this was an actual diagnosis; patient information given; start antibiotics; return if worsening; discussed possibility of surgical consultation if large or persistent areas of break-outs; discussed risk of C diff after course of antibiotics; see AVS for instructions given to reduce risk of developing C diff, seeking care if watery diarrhea develops

## 2015-03-30 NOTE — Assessment & Plan Note (Addendum)
Patient demonstrates significant anxiety, Rx given for limited amount of medicine to use for stormy moments, not designed to be used daily; she does not want to see psychiatrist; f/u for next refill in 3 months

## 2015-06-13 ENCOUNTER — Ambulatory Visit: Payer: Managed Care, Other (non HMO) | Admitting: Unknown Physician Specialty

## 2015-11-03 ENCOUNTER — Encounter: Payer: Self-pay | Admitting: Family Medicine

## 2015-11-03 ENCOUNTER — Ambulatory Visit
Admission: RE | Admit: 2015-11-03 | Discharge: 2015-11-03 | Disposition: A | Discharge status: Home / Self Care | Payer: Managed Care, Other (non HMO) | Source: Ambulatory Visit | Attending: Family Medicine | Admitting: Family Medicine

## 2015-11-03 ENCOUNTER — Other Ambulatory Visit: Payer: Self-pay | Admitting: Family Medicine

## 2015-11-03 ENCOUNTER — Ambulatory Visit (INDEPENDENT_AMBULATORY_CARE_PROVIDER_SITE_OTHER): Payer: Managed Care, Other (non HMO) | Admitting: Family Medicine

## 2015-11-03 DIAGNOSIS — M7730 Calcaneal spur, unspecified foot: Secondary | ICD-10-CM

## 2015-11-03 DIAGNOSIS — M2550 Pain in unspecified joint: Secondary | ICD-10-CM | POA: Diagnosis not present

## 2015-11-03 DIAGNOSIS — M79671 Pain in right foot: Secondary | ICD-10-CM | POA: Insufficient documentation

## 2015-11-03 DIAGNOSIS — M79672 Pain in left foot: Secondary | ICD-10-CM | POA: Insufficient documentation

## 2015-11-03 DIAGNOSIS — M791 Myalgia, unspecified site: Secondary | ICD-10-CM

## 2015-11-03 DIAGNOSIS — E559 Vitamin D deficiency, unspecified: Secondary | ICD-10-CM | POA: Diagnosis not present

## 2015-11-03 DIAGNOSIS — R635 Abnormal weight gain: Secondary | ICD-10-CM | POA: Insufficient documentation

## 2015-11-03 DIAGNOSIS — M7731 Calcaneal spur, right foot: Secondary | ICD-10-CM | POA: Insufficient documentation

## 2015-11-03 DIAGNOSIS — Z9884 Bariatric surgery status: Secondary | ICD-10-CM

## 2015-11-03 DIAGNOSIS — F419 Anxiety disorder, unspecified: Secondary | ICD-10-CM | POA: Diagnosis not present

## 2015-11-03 MED ORDER — ALPRAZOLAM 0.5 MG PO TABS: 0.5000 mg | ORAL_TABLET | Freq: Four times a day (QID) | ORAL | 1 refills | Status: DC | PRN

## 2015-11-03 NOTE — Assessment & Plan Note (Signed)
Refer to podiatrist 

## 2015-11-03 NOTE — Assessment & Plan Note (Addendum)
Check xrays; suspect heel spurs; refer to podiatrist if needed

## 2015-11-03 NOTE — Patient Instructions (Signed)
Okay to take 600 mg of ibuprofen every 6 to 8 hours, max dose per day is 2400 mg total Okay to take tylenol per package directions Let's get labs today You can have your xrays done across the street today or next week Try turmeric as a natural anti-inflammatory (for pain and arthritis). It comes in capsules where you buy aspirin and fish oil, but also as a spice where you buy pepper and garlic powder.  Check out the information at familydoctor.org entitled "Nutrition for Weight Loss: What You Need to Know about Fad Diets" Try to lose between 1-2 pounds per week by taking in fewer calories and burning off more calories You can succeed by limiting portions, limiting foods dense in calories and fat, becoming more active, and drinking 8 glasses of water a day (64 ounces) Don't skip meals, especially breakfast, as skipping meals may alter your metabolism Do not use over-the-counter weight loss pills or gimmicks that claim rapid weight loss A healthy BMI (or body mass index) is between 18.5 and 24.9 You can calculate your ideal BMI at the [place] website [employer]fr

## 2015-11-03 NOTE — Progress Notes (Signed)
BP 124/84 (BP Location: Right Arm, Patient Position: Sitting, Cuff Size: Large)   Pulse 80   Temp 97.3 F (36.3 C)   Resp 18   Ht 5\' 4"  (1.626 m)   Wt (!) 306 lb 1 oz (138.8 kg)   LMP  (LMP Unknown)   SpO2 95%   BMI 52.54 kg/m    Subjective:    Patient ID: Jasmine Tucker, female    DOB: 12-04-1970, 45 y.o.   MRN: [id number]  HPI: Jasmine Tucker is a 45 y.o. female  Chief Complaint  Patient presents with  . Obesity  . Anxiety   Patient is here for f/u I saw her in January 2017 at the other office, note reviewed  Terrible pain in the feet; pain in the heels; wearing good supportive shoes; mother had this too; worse in the morning; gained weight and extra weight on her feet; hurts to walk; feels so achy, to lift a cup of coffee is hard; she feels achy all over; having to take 600 mg ibuprofen and a tylenol to get going for work  Has gained a lot of weight; she had gastric bypass surgery maybe 15 years ago; no known thyroid disease; no recent sedentary activity; depression with losing her father in April 2017, pancreatic cancer; not eating for coping mechanism she thinks  378 pounds was her peak weight  [facility] website reviewed; I am the only prescriber, Rx written in January was actually filled in March, 30 pills  She has hidradenitis; she had a sore the size of a golf ball before it drained  She had hysterectomy last year; hemoglobin was a three at one time she says before they took out her uterus  Depression screen [facility] 2/9 11/03/2015 10/18/2014  Decreased Interest 0 0  Down, Depressed, Hopeless 1 0  PHQ - 2 Score 1 0  Altered sleeping - 2  Tired, decreased energy - 0  Change in appetite - 0  Feeling bad or failure about yourself  - 0  Trouble concentrating - 0  Moving slowly or fidgety/restless - 0  Suicidal thoughts - 0  PHQ-9 Score - 2   Relevant past medical, surgical, family and social history reviewed Past Medical History:  Diagnosis Date  . Anemia   .  Anxiety   . Family history of breast cancer   . Family history of ovarian cancer   . GERD (gastroesophageal reflux disease)   . Menorrhagia   . Thrombocytosis ([place])    Past Surgical History:  Procedure Laterality Date  . ABDOMINAL HYSTERECTOMY    . CHOLECYSTECTOMY    . GASTRIC BYPASS  2000  . LAPAROSCOPIC BILATERAL SALPINGECTOMY Bilateral 07/20/2014   Procedure: LAPAROSCOPIC BILATERAL SALPINGECTOMY;  Surgeon: Gae Dry, MD;  Location: [facility] ORS;  Service: Gynecology;  Laterality: Bilateral;  . LAPAROSCOPIC HYSTERECTOMY N/A 07/20/2014   Procedure: HYSTERECTOMY TOTAL LAPAROSCOPIC;  Surgeon: Gae Dry, MD;  Location: [facility] ORS;  Service: Gynecology;  Laterality: N/A;  . LAPAROSCOPIC OOPHERECTOMY Bilateral 07/20/2014   Procedure: LAPAROSCOPIC OOPHERECTOMY-(POSSIBLE);  Surgeon: Gae Dry, MD;  Location: [facility] ORS;  Service: Gynecology;  Laterality: Bilateral;   Family History  Problem Relation Age of Onset  . Ovarian cancer Mother 25  . Breast cancer Maternal Aunt 65  . Cancer Paternal Aunt     either cervical or ovarian cancer  . Cervical cancer Maternal Aunt     reportedly the cause of death was cervical cancer  . Lung cancer Paternal Aunt  Social History  Substance Use Topics  . Smoking status: Never Smoker  . Smokeless tobacco: Never Used  . Alcohol use Yes     Comment: socially   Interim medical history since last visit reviewed. Allergies and medications reviewed  Review of Systems Per HPI unless specifically indicated above     Objective:    BP 124/84 (BP Location: Right Arm, Patient Position: Sitting, Cuff Size: Large)   Pulse 80   Temp 97.3 F (36.3 C)   Resp 18   Ht 5\' 4"  (1.626 m)   Wt (!) 306 lb 1 oz (138.8 kg)   LMP  (LMP Unknown)   SpO2 95%   BMI 52.54 kg/m   Wt Readings from Last 3 Encounters:  11/03/15 (!) 306 lb 1 oz (138.8 kg)  03/18/15 284 lb (128.8 kg)  10/18/14 262 lb 3.2 oz (118.9 kg)    Physical Exam  Constitutional: She  appears well-developed and well-nourished. No distress.  Morbidly obese; weight gain of 44 pounds in the last year  HENT:  Head: Normocephalic and atraumatic.  Eyes: EOM are normal. No scleral icterus.  Neck: No thyromegaly present.  Cardiovascular: Normal rate, regular rhythm and normal heart sounds.   No murmur heard. Pulmonary/Chest: Effort normal and breath sounds normal. No respiratory distress. She has no wheezes.  Abdominal: Soft. Bowel sounds are normal. She exhibits no distension.  Musculoskeletal: Normal range of motion. She exhibits no edema.       Right foot: There is tenderness (under the heel; not along plantar fascia more distally).       Left foot: There is tenderness (pain under the heel, not along plantar fascia distally).  No ulnar deviation of the fingers; no MCP swelling  Neurological: She is alert. She exhibits normal muscle tone.  Skin: Skin is warm and dry. She is not diaphoretic. No pallor.  No malar rash  Psychiatric: Her mood appears anxious. She is not hyperactive, not slowed and not withdrawn. Cognition and memory are not impaired. She does not express impulsivity. She does not exhibit a depressed mood.  Good eye contact with examiner; anxious about her weight, her health, her aches      Assessment & Plan:   Problem List Items Addressed This Visit      Other   Weight gain, abnormal    Check thyroid labs      Relevant Orders   TSH (Completed)   T4, free (Completed)   Vitamin D deficiency    Check level vit D and supplement if needed; last level reviewed      Relevant Orders   VITAMIN D 25 Hydroxy (Vit-D Deficiency, Fractures) (Completed)   Pain in both feet    Check xrays; refer to podiatrist if needed      Relevant Orders   DG Foot Complete Left (Completed)   DG Foot Complete Right (Completed)   C-reactive protein (Completed)   ENA 9 Panel   CBC with Differential/Platelet (Completed)   Myalgia    Check labs      Morbid obesity ([place])     Check TSH given the fact that she has gained 44 pounds over the last 12 months; see after visit summary for advice and tips on how to lose weight gradually and safely      Chronic anxiety    Very limited amount of benzo prescribed; no concern at this time for misuse or diversion; do NOT combine with pain medicine that has opiates, and do not combine with  sleeping pills or narcotic cough syrup      Relevant Medications   ALPRAZolam (XANAX) 0.5 MG tablet   Bariatric surgery status    Check B12      Relevant Orders   COMPLETE METABOLIC PANEL WITH GFR (Completed)   Arthralgia    Check labs; may continue the ibuprofen 600 mg and tylenol three time four times a day      Relevant Orders   ANA (Completed)   Anti-nuclear ab-titer (ANA titer) (Completed)    Other Visit Diagnoses   None.      Follow up plan: Return in about 3 weeks (around 11/24/2015) for follow-up.  An after-visit summary was printed and given to the patient at [facility].  Please see the patient instructions which may contain other information and recommendations beyond what is mentioned above in the assessment and plan.  Meds ordered this encounter  Medications  . ALPRAZolam (XANAX) 0.5 MG tablet    Sig: Take 1 tablet (0.5 mg total) by mouth every 6 (six) hours as needed for anxiety.    Dispense:  20 tablet    Refill:  1    Orders Placed This Encounter  Procedures  . DG Foot Complete Left  . DG Foot Complete Right  . C-reactive protein  . ENA 9 Panel  . CBC with Differential/Platelet  . VITAMIN D 25 Hydroxy (Vit-D Deficiency, Fractures)  . TSH  . T4, free  . COMPLETE METABOLIC PANEL WITH GFR  . ANA  . Anti-nuclear ab-titer (ANA titer)

## 2015-11-03 NOTE — Assessment & Plan Note (Signed)
Check B12 

## 2015-11-03 NOTE — Progress Notes (Signed)
Heel spurs, refer to pod

## 2015-11-03 NOTE — Assessment & Plan Note (Signed)
Check labs 

## 2015-11-03 NOTE — Assessment & Plan Note (Addendum)
Check TSH given the fact that she has gained 44 pounds over the last 12 months; see after visit summary for advice and tips on how to lose weight gradually and safely

## 2015-11-03 NOTE — Assessment & Plan Note (Signed)
Check labs; may continue the ibuprofen 600 mg and tylenol three time four times a day

## 2015-11-03 NOTE — Assessment & Plan Note (Signed)
Check thyroid labs

## 2015-11-03 NOTE — Assessment & Plan Note (Signed)
Check level vit D and supplement if needed; last level reviewed

## 2015-11-04 ENCOUNTER — Other Ambulatory Visit: Payer: Self-pay

## 2015-11-04 ENCOUNTER — Other Ambulatory Visit: Payer: Self-pay | Admitting: Family Medicine

## 2015-11-04 DIAGNOSIS — R718 Other abnormality of red blood cells: Secondary | ICD-10-CM

## 2015-11-04 DIAGNOSIS — R768 Other specified abnormal immunological findings in serum: Secondary | ICD-10-CM | POA: Insufficient documentation

## 2015-11-04 DIAGNOSIS — M2550 Pain in unspecified joint: Secondary | ICD-10-CM

## 2015-11-04 DIAGNOSIS — M791 Myalgia, unspecified site: Secondary | ICD-10-CM

## 2015-11-04 LAB — ANA: ANA: POSITIVE — AB

## 2015-11-04 LAB — CBC WITH DIFFERENTIAL/PLATELET
BASOS PCT: 1 %
Basophils Absolute: 70 cells/uL (ref 0–200)
EOS PCT: 5 %
Eosinophils Absolute: 350 cells/uL (ref 15–500)
HCT: 40.9 % (ref 35.0–45.0)
Hemoglobin: 12.8 g/dL (ref 11.7–15.5)
LYMPHS PCT: 32 %
Lymphs Abs: 2240 cells/uL (ref 850–3900)
MCH: 25 pg — ABNORMAL LOW (ref 27.0–33.0)
MCHC: 31.3 g/dL — ABNORMAL LOW (ref 32.0–36.0)
MCV: 79.9 fL — ABNORMAL LOW (ref 80.0–100.0)
MONOS PCT: 8 %
MPV: 10.3 fL (ref 7.5–12.5)
Monocytes Absolute: 560 cells/uL (ref 200–950)
Neutro Abs: 3780 cells/uL (ref 1500–7800)
Neutrophils Relative %: 54 %
PLATELETS: 278 10*3/uL (ref 140–400)
RBC: 5.12 MIL/uL — AB (ref 3.80–5.10)
RDW: 15.9 % — AB (ref 11.0–15.0)
WBC: 7 10*3/uL (ref 3.8–10.8)

## 2015-11-04 LAB — COMPLETE METABOLIC PANEL WITH GFR
ALT: 21 U/L (ref 6–29)
AST: 24 U/L (ref 10–35)
Albumin: 4 g/dL (ref 3.6–5.1)
Alkaline Phosphatase: 92 U/L (ref 33–115)
BILIRUBIN TOTAL: 0.4 mg/dL (ref 0.2–1.2)
BUN: 13 mg/dL (ref 7–25)
CALCIUM: 9 mg/dL (ref 8.6–10.2)
CHLORIDE: 105 mmol/L (ref 98–110)
CO2: 27 mmol/L (ref 20–31)
Creat: 0.52 mg/dL (ref 0.50–1.10)
Glucose, Bld: 82 mg/dL (ref 65–99)
Potassium: 4.3 mmol/L (ref 3.5–5.3)
Sodium: 141 mmol/L (ref 135–146)
TOTAL PROTEIN: 7.1 g/dL (ref 6.1–8.1)

## 2015-11-04 LAB — VITAMIN D 25 HYDROXY (VIT D DEFICIENCY, FRACTURES): Vit D, 25-Hydroxy: 11 ng/mL — ABNORMAL LOW (ref 30–100)

## 2015-11-04 LAB — TSH: TSH: 0.69 mIU/L

## 2015-11-04 LAB — ANTI-NUCLEAR AB-TITER (ANA TITER)

## 2015-11-04 LAB — C-REACTIVE PROTEIN: CRP: <0.5 mg/dL (ref <0.60)

## 2015-11-04 LAB — T4, FREE: FREE T4: 1.2 ng/dL (ref 0.8–1.8)

## 2015-11-04 MED ORDER — VITAMIN D (ERGOCALCIFEROL) 1.25 MG (50000 UNIT) PO CAPS: 50000.0000 [IU] | ORAL_CAPSULE | ORAL | 1 refills | Status: AC

## 2015-11-04 NOTE — Assessment & Plan Note (Signed)
Add on ferritin

## 2015-11-04 NOTE — Assessment & Plan Note (Signed)
Refer to rheum 

## 2015-11-05 ENCOUNTER — Telehealth: Payer: Self-pay | Admitting: Family Medicine

## 2015-11-05 LAB — FERRITIN: FERRITIN: 12 ng/mL (ref 10–232)

## 2015-11-05 MED ORDER — PREDNISONE 10 MG PO TABS: ORAL_TABLET | ORAL | 0 refills | Status: AC

## 2015-11-05 NOTE — Telephone Encounter (Signed)
I explained results to patient; start vit D (very low) Very high titer of ANA, two are positive I've already put in referral to rheum; with her level of pain, I'll start just five days of prednisone to try to get her some relief while we're waiting for an appt; don't take any NSAIDs with this

## 2015-11-07 NOTE — Assessment & Plan Note (Signed)
Very limited amount of benzo prescribed; no concern at this time for misuse or diversion; do NOT combine with pain medicine that has opiates, and do not combine with sleeping pills or narcotic cough syrup

## 2015-11-21 ENCOUNTER — Ambulatory Visit: Payer: Self-pay

## 2015-11-21 ENCOUNTER — Ambulatory Visit (INDEPENDENT_AMBULATORY_CARE_PROVIDER_SITE_OTHER): Payer: Managed Care, Other (non HMO) | Admitting: Podiatry

## 2015-11-21 ENCOUNTER — Encounter (INDEPENDENT_AMBULATORY_CARE_PROVIDER_SITE_OTHER): Payer: Managed Care, Other (non HMO) | Admitting: Podiatry

## 2015-11-21 ENCOUNTER — Encounter: Payer: Self-pay | Admitting: Podiatry

## 2015-11-21 VITALS — BP 123/81 | HR 68 | Resp 16

## 2015-11-21 DIAGNOSIS — M722 Plantar fascial fibromatosis: Secondary | ICD-10-CM

## 2015-11-21 MED ORDER — MELOXICAM 15 MG PO TABS: 15.0000 mg | ORAL_TABLET | Freq: Every day | ORAL | 3 refills | Status: DC

## 2015-11-21 MED ORDER — DICLOFENAC SODIUM 1 % TD GEL: 4.0000 g | Freq: Four times a day (QID) | TRANSDERMAL | 3 refills | Status: DC

## 2015-11-21 NOTE — Patient Instructions (Signed)

## 2015-11-21 NOTE — Progress Notes (Signed)
This encounter was created in error - please disregard.

## 2015-11-21 NOTE — Progress Notes (Signed)
She presents today with chief complaint of pain to the plantar heel bilaterally. She states that the right hurts more than the left and has been going on now for the past several months. She went to see her primary care provider. X-rays right foot and recommended prednisone stating that she had heel spurs and that that was the cause of her pain. She states that the anti-inflammatory failed to help.  Objective: Vital signs are stable alert and oriented 3. Pulses are strongly palpable. Neurologic sensory was intact. Deep tendon reflexes are equal and symmetrical bilateral. Muscle strength +5/5 dorsiflexion plantar flexors and inverters everters all intrinsic musculature is intact. Orthopedic evaluation and x-rays all joints distal to the ankle for range of motion or crepitation. She does have pain on palpation medial calcaneal tubercles bilateral. I reviewed radiographs taken on 11/02/2015 with the diagnosis of plantar calcaneal heel spur distally oriented. Soft tissue increase in density is also visible at the plantar fascia calcaneal insertion site indicative of plantar fasciitis. Cutaneous evaluation of Mr. supple well-hydrated cutis no open lesions or wounds area  Assessment: Plantar fasciitis bilateral left greater than right.  Plan: I initiated injection to bilateral heels today. Kenalog and local anesthetic was injected to bilateral heels the point of maximal tenderness. I also placed her into a plantar fascia strap be followed by a night splint right. Also dispensed a prednisone taper pack to be followed by meloxicam. We discussed appropriate shoe gear stretching exercises ice therapy issues or modifications. Recommended that she follow up with [place] in 1 month. We also dispensed undergoing exercises.

## 2015-11-25 ENCOUNTER — Ambulatory Visit: Payer: Managed Care, Other (non HMO) | Admitting: Family Medicine

## 2015-12-09 ENCOUNTER — Telehealth: Payer: Self-pay | Admitting: Unknown Physician Specialty

## 2015-12-09 ENCOUNTER — Ambulatory Visit: Payer: Managed Care, Other (non HMO) | Admitting: Family Medicine

## 2015-12-09 NOTE — Telephone Encounter (Signed)
Patient notified

## 2015-12-09 NOTE — Telephone Encounter (Signed)
PT HAS QUESTION ABOUT HER TEST RESULTS AND THAT SHE IS TO BE TAKEN  SOMETHING NOT SURE WHAT IT SI.

## 2015-12-13 ENCOUNTER — Encounter: Payer: Self-pay | Admitting: Podiatry

## 2015-12-13 ENCOUNTER — Ambulatory Visit (INDEPENDENT_AMBULATORY_CARE_PROVIDER_SITE_OTHER): Payer: Managed Care, Other (non HMO) | Admitting: Podiatry

## 2015-12-13 VITALS — BP 134/84 | HR 67 | Resp 16

## 2015-12-13 DIAGNOSIS — M62179 Other rupture of muscle (nontraumatic), unspecified ankle and foot: Secondary | ICD-10-CM | POA: Diagnosis not present

## 2015-12-13 NOTE — Patient Instructions (Signed)
Today he described a sudden popping sensation in your right foot causing immediate pain. The exam today demonstrates exquisite tenderness in the fascial band right duplicating the discomfort. Most likely the plantar fascia as a partial tear within the band causing this discomfort. Is okay to where your existing fasciitis strap if it is comfortable in addition to the walking boot on the right foot Continue taking the meloxicam 15 mg once daily as prescribed. If you need additional pain meds add on acetaminophen do not exceed the recommended dosage. Also okay to use ice on the right heel for the next 24-48 hours and 15-20 minute increments do not freeze the heel

## 2015-12-13 NOTE — Progress Notes (Signed)
   Subjective:    Patient ID: Jasmine Tucker, female    DOB: Mar 12, 1970, 45 y.o.   MRN: [id number]  HPI This patient presents today complaining of a painful right heel with a popping sensation occurring when she was walking down the steps today. She noticed immediate pain in the right heel and is having difficulty weightbearing on the heel. Patient has a history of plantar fasciitis and has a history of a Kenalog Injection into the right and left heels on the visit of 11/21/2015. Patient currently taking Mobic 15 mg once daily without a complaint and wearing a fasciitis strap   Review of Systems  All other systems reviewed and are negative.      Objective:   Physical Exam   BP of 134/84 Pulse 67 Respirations 16  Orientated 3  Vascular: No peripheral edema noted bilaterally No calf tenderness bilaterally DP and PT pulses 2/4 bilaterally Reflex immediate bilaterally  Neurological: Sensation to 10 g monofilament wire intact 5/5 bilaterally Vibratory sedation reactive bilaterally Ankle reflex equal and reactive bilaterally  Dermatological: No evidence of open skin lesions bilaterally There is no erythema, edema, ecchymosis noted in the right foot  Musculoskeletal: Manual motor testing: Dorsi flexion, plantar flexion, inversion, eversion 5/5 bilaterally Exquisite palpable tenderness medial fascial band right at the insertional area without any palpable lesions. This duplicates area of discomfort. There is no palpable tenderness on the calcaneus       Assessment & Plan:   Assessment: Probable plantar fascial tear right  Plan: Today I informed patient that most likely her symptoms are consistent with a partial tear or complete tear of the plantar fascia in the right foot. Recommend icing of the right heel Continue wear fasciitis strap if comfortable Dispensed Cam Walker boot to wear and right foot except when driving Maintain meloxicam 15 mg once daily Add on  acetaminophen when necessary for additional pain control  Reappoint next week for follow-up with Dr. Milinda Pointer in the [place] office

## 2015-12-21 ENCOUNTER — Ambulatory Visit: Payer: Managed Care, Other (non HMO) | Admitting: Podiatry

## 2015-12-28 ENCOUNTER — Ambulatory Visit: Payer: Managed Care, Other (non HMO) | Admitting: Family Medicine

## 2016-01-23 ENCOUNTER — Ambulatory Visit (INDEPENDENT_AMBULATORY_CARE_PROVIDER_SITE_OTHER): Payer: Managed Care, Other (non HMO) | Admitting: Podiatry

## 2016-01-23 DIAGNOSIS — M62179 Other rupture of muscle (nontraumatic), unspecified ankle and foot: Secondary | ICD-10-CM

## 2016-01-23 DIAGNOSIS — M722 Plantar fascial fibromatosis: Secondary | ICD-10-CM | POA: Diagnosis not present

## 2016-01-23 MED ORDER — DICLOFENAC SODIUM 75 MG PO TBEC: 75.0000 mg | DELAYED_RELEASE_TABLET | Freq: Two times a day (BID) | ORAL | 3 refills | Status: DC

## 2016-01-23 NOTE — Progress Notes (Signed)
She presents today for follow-up of plantar fasciitis right foot. Saw Dr. Amalia Hailey last visit who diagnosed with a rupture of the plantar fascia. She was placed in a cam walker and was doing much better. She states that 2 weeks ago she decided to try some exercise and ended up injuring the foot once again she states now the pain is worse than when it was before. She has not been wearing the boot lately.  Objective: Vital signs are stable alert and oriented 3. Pulses are palpable pain. She has nonpalpable medial band of the plantar fascia that the majority of the pain is laterally located along the lateral plantar fascial band at the tubercle.  Assessment: Rupture of the plantar fascia with lateral plantar fasciitis.  Plan: I injected the area today and recommended that she get into her Cam Walker and stay in it and I will follow-up with her in 1 month at which time if she's not improved and MRI will be necessary.

## 2016-02-22 ENCOUNTER — Encounter (INDEPENDENT_AMBULATORY_CARE_PROVIDER_SITE_OTHER): Payer: Managed Care, Other (non HMO) | Admitting: Podiatry

## 2016-02-22 NOTE — Progress Notes (Signed)
This encounter was created in error - please disregard.

## 2016-08-06 ENCOUNTER — Other Ambulatory Visit: Payer: Self-pay

## 2016-08-06 ENCOUNTER — Telehealth: Payer: Self-pay | Admitting: Podiatry

## 2016-08-06 ENCOUNTER — Telehealth: Payer: Self-pay

## 2016-08-06 MED ORDER — MELOXICAM 15 MG PO TABS: 15.0000 mg | ORAL_TABLET | Freq: Every day | ORAL | 3 refills | Status: DC

## 2016-08-06 NOTE — Telephone Encounter (Signed)
Pt was asking for a refill on her Meloxicam to hold her over until her appt 06/082018. I mention to pt the last time she seen Dr.lada was 01/23/2016 which was 6 months ago. Most likely she will like to  see her and speak with her about any concerns she has before refill it. Pt states she feel really bad and the same concerns she had 6 months ago never was resolved. She was referred to Dr. Meda Coffee last October. She states she didn't hear back about the lab results which mad her think she was ok. I asked her did she call and follow- up with Dr. Meda Coffee, it states in care everywhere that she was suppose to follow- up with Dr. Meda Coffee four months after the first initial  Appt;  Pt never did. She states it cost her $ 800 and she did not want to go back because she didn't feel like she should pay that much  and nothing was resolved. Pt states she will discuss her concerns with Dr, lada Friday to see where she can do.

## 2016-08-06 NOTE — Telephone Encounter (Signed)
Per Dr. Milinda Pointer, ok to refill Meloxicam.  Rx has been sent to [facility]

## 2016-08-06 NOTE — Telephone Encounter (Signed)
Patient called, asking for refill on her Meloxicam. She would like it called in to [place], [place] . IF you need to reach her, please call her work number [phone].

## 2016-08-10 ENCOUNTER — Ambulatory Visit (INDEPENDENT_AMBULATORY_CARE_PROVIDER_SITE_OTHER): Payer: Managed Care, Other (non HMO) | Admitting: Family Medicine

## 2016-08-10 ENCOUNTER — Encounter: Payer: Self-pay | Admitting: Family Medicine

## 2016-08-10 VITALS — BP 130/82 | HR 96 | Temp 97.8°F | Resp 16 | Ht 64.0 in | Wt 324.6 lb

## 2016-08-10 DIAGNOSIS — M7730 Calcaneal spur, unspecified foot: Secondary | ICD-10-CM

## 2016-08-10 DIAGNOSIS — Z9884 Bariatric surgery status: Secondary | ICD-10-CM | POA: Diagnosis not present

## 2016-08-10 DIAGNOSIS — N6452 Nipple discharge: Secondary | ICD-10-CM

## 2016-08-10 DIAGNOSIS — E559 Vitamin D deficiency, unspecified: Secondary | ICD-10-CM | POA: Diagnosis not present

## 2016-08-10 DIAGNOSIS — F419 Anxiety disorder, unspecified: Secondary | ICD-10-CM

## 2016-08-10 DIAGNOSIS — N644 Mastodynia: Secondary | ICD-10-CM | POA: Diagnosis not present

## 2016-08-10 DIAGNOSIS — R635 Abnormal weight gain: Secondary | ICD-10-CM | POA: Diagnosis not present

## 2016-08-10 LAB — CBC WITH DIFFERENTIAL/PLATELET
BASOS PCT: 1 %
Basophils Absolute: 63 cells/uL (ref 0–200)
EOS PCT: 3 %
Eosinophils Absolute: 189 cells/uL (ref 15–500)
HEMATOCRIT: 41.8 % (ref 35.0–45.0)
Hemoglobin: 13.4 g/dL (ref 11.7–15.5)
LYMPHS PCT: 36 %
Lymphs Abs: 2268 cells/uL (ref 850–3900)
MCH: 25.7 pg — ABNORMAL LOW (ref 27.0–33.0)
MCHC: 32.1 g/dL (ref 32.0–36.0)
MCV: 80.2 fL (ref 80.0–100.0)
MONO ABS: 504 {cells}/uL (ref 200–950)
MPV: 9.7 fL (ref 7.5–12.5)
Monocytes Relative: 8 %
Neutro Abs: 3276 cells/uL (ref 1500–7800)
Neutrophils Relative %: 52 %
PLATELETS: 276 10*3/uL (ref 140–400)
RBC: 5.21 MIL/uL — AB (ref 3.80–5.10)
RDW: 15.1 % — AB (ref 11.0–15.0)
WBC: 6.3 10*3/uL (ref 3.8–10.8)

## 2016-08-10 LAB — TSH: TSH: 0.46 m[IU]/L

## 2016-08-10 LAB — T4, FREE: Free T4: 1.2 ng/dL (ref 0.8–1.8)

## 2016-08-10 MED ORDER — FLUOXETINE HCL 20 MG PO TABS: 20.0000 mg | ORAL_TABLET | Freq: Every day | ORAL | 5 refills | Status: DC

## 2016-08-10 MED ORDER — BUSPIRONE HCL 15 MG PO TABS: 7.5000 mg | ORAL_TABLET | Freq: Two times a day (BID) | ORAL | 1 refills | Status: DC | PRN

## 2016-08-10 NOTE — Assessment & Plan Note (Signed)
Check labs 

## 2016-08-10 NOTE — Progress Notes (Signed)
BP 130/82 (BP Location: Left Arm, Patient Position: Sitting, Cuff Size: Normal)   Pulse 96   Temp 97.8 F (36.6 C) (Oral)   Resp 16   Ht 5\' 4"  (1.626 m)   Wt (!) 324 lb 9.6 oz (147.2 kg)   LMP  (LMP Unknown)   SpO2 96%   BMI 55.72 kg/m    Subjective:    Patient ID: Jasmine Tucker, female    DOB: Jul 15, 1970, 46 y.o.   MRN: [id number]  HPI: Jasmine Tucker is a 46 y.o. female  Chief Complaint  Patient presents with  . Medication Refill  . Foot Swelling    both heel of pt feet  . Breast Pain    some discharge coming out of the breast     HPI Patient is here for a few things  She had ruptured plantar fascia and saw podiatrist and was put in a boot;  Both feet hurt; the right is worse than left; it's the heels that actually hurt It's not spurs, just inflammation She was put on meloxicam, plus tylenol; can get by Reviewed last note from 01/23/16 Not sure if just is too heavy, gained 20 pounds since last visit Had a gastric bypass 18 years ago; gained back 100 pounds Last thyroid was August 2017 Positive ANA  She has breast pain with discharge Just last month, bra was sticking to her, pulled it up and squeezed out clear liquid Just the LEFT side; some aching in the breasts, full hysterectomy and BSO; bleeding all the time, made her anemic; she had a blood transfusion  Right foot xray: CLINICAL DATA:  pain for 5 months  EXAM: RIGHT FOOT COMPLETE - 3+ VIEW  COMPARISON:  August 09, 2014  FINDINGS: Frontal, oblique, and lateral views obtained. There is chronic soft tissue prominence at the level of the first MTP joint. There is no fracture or dislocation. There is no appreciable joint space narrowing or erosion. There are spurs arising from the posterior and inferior calcaneus. No erosive change.  IMPRESSION: Stable soft tissue prominence at the first MTP joint level. No fracture or dislocation. No apparent arthropathic change. There are calcaneal  spurs.   Electronically Signed   By: Lowella Grip III M.D.   On: 11/03/2015 17:06  Left foot xray: CLINICAL DATA:  Patient reports bi-lateral plantar foot pain x5 months. Patient reports most pain is on plantar surface of heel on both feet. No known injury. No previous injuries or surgeries.  EXAM: LEFT FOOT - COMPLETE 3+ VIEW  COMPARISON:  None.  FINDINGS: No fracture.  No bone lesion.  Joints are normally spaced and aligned.  There are moderate plantar and dorsal calcaneal spurs.  Soft tissues are unremarkable.  IMPRESSION: Heel spurs.  No other abnormalities.   Electronically Signed   By: Lajean Manes M.D.   On: 11/03/2015 17:05  She uses alprazolam rarely; last refill expired; drinks alcohol  Depression screen [facility] 2/9 08/10/2016 11/03/2015 10/18/2014  Decreased Interest 0 0 0  Down, Depressed, Hopeless 0 1 0  PHQ - 2 Score 0 1 0  Altered sleeping - - 2  Tired, decreased energy - - 0  Change in appetite - - 0  Feeling bad or failure about yourself  - - 0  Trouble concentrating - - 0  Moving slowly or fidgety/restless - - 0  Suicidal thoughts - - 0  PHQ-9 Score - - 2   Relevant past medical, surgical, family and social history reviewed Past  Medical History:  Diagnosis Date  . Anemia   . Anxiety   . Family history of breast cancer   . Family history of ovarian cancer   . GERD (gastroesophageal reflux disease)   . Menorrhagia   . Plantar fasciitis 2017  . Thrombocytosis ([place])    Past Surgical History:  Procedure Laterality Date  . ABDOMINAL HYSTERECTOMY    . CHOLECYSTECTOMY    . GASTRIC BYPASS  2000  . LAPAROSCOPIC BILATERAL SALPINGECTOMY Bilateral 07/20/2014   Procedure: LAPAROSCOPIC BILATERAL SALPINGECTOMY;  Surgeon: Gae Dry, MD;  Location: [facility] ORS;  Service: Gynecology;  Laterality: Bilateral;  . LAPAROSCOPIC HYSTERECTOMY N/A 07/20/2014   Procedure: HYSTERECTOMY TOTAL LAPAROSCOPIC;  Surgeon: Gae Dry, MD;  Location:  [facility] ORS;  Service: Gynecology;  Laterality: N/A;  . LAPAROSCOPIC OOPHERECTOMY Bilateral 07/20/2014   Procedure: LAPAROSCOPIC OOPHERECTOMY-(POSSIBLE);  Surgeon: Gae Dry, MD;  Location: [facility] ORS;  Service: Gynecology;  Laterality: Bilateral;   Family History  Problem Relation Age of Onset  . Ovarian cancer Mother 79  . Breast cancer Maternal Aunt 65  . Cancer Paternal Aunt        either cervical or ovarian cancer  . Cervical cancer Maternal Aunt        reportedly the cause of death was cervical cancer  . Lung cancer Paternal Aunt    Social History   Social History  . Marital status: Married    Spouse name: N/A  . Number of children: 1  . Years of education: N/A   Occupational History  . Not on file.   Social History Main Topics  . Smoking status: Never Smoker  . Smokeless tobacco: Never Used  . Alcohol use Yes     Comment: socially  . Drug use: No  . Sexual activity: No   Other Topics Concern  . Not on file   Social History Narrative  . No narrative on file   Interim medical history since last visit reviewed. Allergies and medications reviewed  Review of Systems Per HPI unless specifically indicated above     Objective:    BP 130/82 (BP Location: Left Arm, Patient Position: Sitting, Cuff Size: Normal)   Pulse 96   Temp 97.8 F (36.6 C) (Oral)   Resp 16   Ht 5\' 4"  (1.626 m)   Wt (!) 324 lb 9.6 oz (147.2 kg)   LMP  (LMP Unknown)   SpO2 96%   BMI 55.72 kg/m   Wt Readings from Last 3 Encounters:  08/10/16 (!) 324 lb 9.6 oz (147.2 kg)  11/03/15 (!) 306 lb 1 oz (138.8 kg)  03/18/15 284 lb (128.8 kg)    Physical Exam  Constitutional: She appears well-developed and well-nourished. No distress.  Morbidly obese; weight gain noted  HENT:  Head: Normocephalic and atraumatic.  Eyes: EOM are normal. No scleral icterus.  Neck: No thyromegaly present.  Cardiovascular: Normal rate and regular rhythm.   Heart sounds distant  Pulmonary/Chest: Effort normal  and breath sounds normal. Right breast exhibits no inverted nipple, no mass, no nipple discharge, no skin change and no tenderness. Left breast exhibits no inverted nipple, no mass, no nipple discharge, no skin change and no tenderness. Breasts are symmetrical.  Breath sounds distant  Abdominal: Soft. Bowel sounds are normal. She exhibits no distension.  Musculoskeletal: Normal range of motion. She exhibits no edema.       Right foot: There is tenderness (under the heel; not along plantar fascia more distally).  Left foot: There is tenderness (pain under the heel, not along plantar fascia distally).  No ulnar deviation of the fingers; no MCP swelling  Neurological: She is alert. She exhibits normal muscle tone.  Skin: Skin is warm and dry. She is not diaphoretic. No pallor.  No malar rash  Psychiatric: Her mood appears not anxious. Cognition and memory are not impaired. She does not express impulsivity. She does not exhibit a depressed mood.  Good eye contact with examiner      Assessment & Plan:   Problem List Items Addressed This Visit      Musculoskeletal and Integument   Heel spur    Noted on xrays last year, weight likely playing a part; refer to another podiatrist      Relevant Orders   Ambulatory referral to Podiatry     Other   Vitamin D deficiency    Check vit D      Relevant Orders   VITAMIN D 25 Hydroxy (Vit-D Deficiency, Fractures) (Completed)   Chronic anxiety    I explained to patient that if she drinks alcohol, I am not comfortable prescribing any benzodiazepine for her; will use buspirone prn and daily SSRI instead      Relevant Medications   busPIRone (BUSPAR) 15 MG tablet   FLUoxetine (PROZAC) 20 MG tablet   Bariatric surgery status    Check labs      Relevant Orders   CBC with Differential/Platelet (Completed)   COMPLETE METABOLIC PANEL WITH GFR (Completed)   Vitamin B12 (Completed)    Other Visit Diagnoses    Discharge of breast    -  Primary    diag mammo and labs   Relevant Orders   Prolactin (Completed)   MM Digital Diagnostic Bilat   Mastalgia       bilateral mammograms   Relevant Orders   MM Digital Diagnostic Bilat   Abnormal weight gain       Relevant Orders   TSH (Completed)   T4, free (Completed)      Follow up plan: No Follow-up on file.  An after-visit summary was printed and given to the patient at [facility].  Please see the patient instructions which may contain other information and recommendations beyond what is mentioned above in the assessment and plan.  Meds ordered this encounter  Medications  . Acetaminophen 500 MG coapsule    Sig: Take by mouth.  . busPIRone (BUSPAR) 15 MG tablet    Sig: Take 0.5 tablets (7.5 mg total) by mouth 2 (two) times daily as needed.    Dispense:  20 tablet    Refill:  1  . FLUoxetine (PROZAC) 20 MG tablet    Sig: Take 1 tablet (20 mg total) by mouth daily.    Dispense:  30 tablet    Refill:  5    Orders Placed This Encounter  Procedures  . MM Digital Diagnostic Bilat  . TSH  . T4, free  . Prolactin  . CBC with Differential/Platelet  . COMPLETE METABOLIC PANEL WITH GFR  . VITAMIN D 25 Hydroxy (Vit-D Deficiency, Fractures)  . Vitamin B12  . Ambulatory referral to Podiatry

## 2016-08-10 NOTE — Assessment & Plan Note (Signed)
Check vit D 

## 2016-08-10 NOTE — Assessment & Plan Note (Signed)
Noted on xrays last year, weight likely playing a part; refer to another podiatrist

## 2016-08-10 NOTE — Patient Instructions (Signed)
Return in 3 weeks for follow-up Start the fluoxetine Use the buspirone IF needed for anxiety We'll get labs today We'll refer you to the foot specialist If you have not heard anything from my staff in a week about any orders/referrals/studies from today, please contact us here to follow-up [phone]

## 2016-08-11 LAB — COMPLETE METABOLIC PANEL WITH GFR
ALT: 17 U/L (ref 6–29)
AST: 22 U/L (ref 10–35)
Albumin: 4 g/dL (ref 3.6–5.1)
Alkaline Phosphatase: 83 U/L (ref 33–115)
BUN: 16 mg/dL (ref 7–25)
CALCIUM: 9.1 mg/dL (ref 8.6–10.2)
CHLORIDE: 107 mmol/L (ref 98–110)
CO2: 26 mmol/L (ref 20–31)
CREATININE: 0.48 mg/dL — AB (ref 0.50–1.10)
GFR, Est African American: >89 mL/min (ref >=60)
GFR, Est Non African American: >89 mL/min (ref >=60)
GLUCOSE: 77 mg/dL (ref 65–99)
POTASSIUM: 4.3 mmol/L (ref 3.5–5.3)
SODIUM: 138 mmol/L (ref 135–146)
Total Bilirubin: 0.4 mg/dL (ref 0.2–1.2)
Total Protein: 6.8 g/dL (ref 6.1–8.1)

## 2016-08-11 LAB — PROLACTIN: PROLACTIN: 7.5 ng/mL

## 2016-08-11 LAB — VITAMIN D 25 HYDROXY (VIT D DEFICIENCY, FRACTURES): Vit D, 25-Hydroxy: 34 ng/mL (ref 30–100)

## 2016-08-11 LAB — VITAMIN B12: VITAMIN B 12: 604 pg/mL (ref 200–1100)

## 2016-08-13 ENCOUNTER — Encounter: Payer: Self-pay | Admitting: Family Medicine

## 2016-08-13 ENCOUNTER — Other Ambulatory Visit: Payer: Self-pay

## 2016-08-13 ENCOUNTER — Telehealth: Payer: Self-pay | Admitting: Family Medicine

## 2016-08-13 DIAGNOSIS — R718 Other abnormality of red blood cells: Secondary | ICD-10-CM

## 2016-08-13 NOTE — Telephone Encounter (Signed)
PT SAID THAT SHE RECEIVED A CALL AND CAN NOT GET HER VOICE MAIL. PLEASE CALL HER BACK WITH WHATEVER YOU NEEDED.

## 2016-08-13 NOTE — Telephone Encounter (Signed)
Went over pt lab results.  

## 2016-08-15 NOTE — Assessment & Plan Note (Signed)
I explained to patient that if she drinks alcohol, I am not comfortable prescribing any benzodiazepine for her; will use buspirone prn and daily SSRI instead

## 2016-08-28 NOTE — Progress Notes (Signed)
[facility] Consultation      Assessment and Plan:  Sleep apnea.  -Symptoms and signs of obstructive sleep apnea, with previous diagnosis of sleep apnea, which resolved with weight loss. Patient has now gained weight back. -We'll send for sleep study.  Dyspnea, possible obesity hypoventilation syndrome.  -Dyspnea on exertion, with noted hypoventilation. -We'll send for pulmonary function testing, blood gas testing to look for evidence of restrictive lung disease and obesity hypoventilation syndrome.  Obesity.  BMI equals 55. -Discussed weight loss, which would be beneficial for her health.  Date: 08/28/2016  MRN# [id number] KIRI HINDERLITER 1970/07/25    Jasmine Tucker is a 46 y.o. old female seen in consultation for chief complaint of:    Chief Complaint  Patient presents with  . Advice Only    Referred By Dr Enid Derry  . Sleeping Problem    HPI:  The patient is a 46 year old female with a history of morbid obesity, vitamin D deficiency, anxiety, gastric bypass surgery 15 years ago. She is seen by rheumatology for History of ANA positive with arthritis.  She notes that she is sleepy during the day. She has gained about 100 lbs in the past year. She has had sleep apnea in the past diagnosed about 18 years ago, she had weight loss surgery back then and it went away.  She notes that she thinks that she does not breathe as much as she needs to. She snores at night.    Review of outside chest x-ray report; report dated 06/08/14, date of film uncertain: Enlargement of cardiac silhouette with pulmonary vascular  congestion.   PMHX:   Past Medical History:  Diagnosis Date  . Anemia   . Anxiety   . Family history of breast cancer   . Family history of ovarian cancer   . GERD (gastroesophageal reflux disease)   . Menorrhagia   . Plantar fasciitis 2017  . Thrombocytosis ([place])    Surgical Hx:  Past Surgical History:  Procedure Laterality Date  .  ABDOMINAL HYSTERECTOMY    . CHOLECYSTECTOMY    . GASTRIC BYPASS  2000  . LAPAROSCOPIC BILATERAL SALPINGECTOMY Bilateral 07/20/2014   Procedure: LAPAROSCOPIC BILATERAL SALPINGECTOMY;  Surgeon: Gae Dry, MD;  Location: [facility] ORS;  Service: Gynecology;  Laterality: Bilateral;  . LAPAROSCOPIC HYSTERECTOMY N/A 07/20/2014   Procedure: HYSTERECTOMY TOTAL LAPAROSCOPIC;  Surgeon: Gae Dry, MD;  Location: [facility] ORS;  Service: Gynecology;  Laterality: N/A;  . LAPAROSCOPIC OOPHERECTOMY Bilateral 07/20/2014   Procedure: LAPAROSCOPIC OOPHERECTOMY-(POSSIBLE);  Surgeon: Gae Dry, MD;  Location: [facility] ORS;  Service: Gynecology;  Laterality: Bilateral;   Family Hx:  Family History  Problem Relation Age of Onset  . Ovarian cancer Mother 39  . Breast cancer Maternal Aunt 65  . Cancer Paternal Aunt        either cervical or ovarian cancer  . Cervical cancer Maternal Aunt        reportedly the cause of death was cervical cancer  . Lung cancer Paternal Aunt    Social Hx:   Social History  Substance Use Topics  . Smoking status: Never Smoker  . Smokeless tobacco: Never Used  . Alcohol use Yes     Comment: socially   Medication:    Current Outpatient Prescriptions:  .  Acetaminophen 500 MG coapsule, Take by mouth., Disp: , Rfl:  .  busPIRone (BUSPAR) 15 MG tablet, Take 0.5 tablets (7.5 mg total) by mouth 2 (two) times daily as needed., Disp:  20 tablet, Rfl: 1 .  FLUoxetine (PROZAC) 20 MG tablet, Take 1 tablet (20 mg total) by mouth daily., Disp: 30 tablet, Rfl: 5 .  meloxicam (MOBIC) 15 MG tablet, Take 1 tablet (15 mg total) by mouth daily., Disp: 30 tablet, Rfl: 3   Allergies:  Latex  Review of Systems: Gen:  Denies  fever, sweats, chills HEENT: Denies blurred vision, double vision. bleeds, sore throat Cvc:  No dizziness, chest pain. Resp:   Denies cough or sputum production, shortness of breath Gi: Denies swallowing difficulty, stomach pain. Gu:  Denies bladder incontinence,  burning urine Ext:   No Joint pain, stiffness. Skin: No skin rash,  hives  Endoc:  No polyuria, polydipsia. Psych: No depression, insomnia. Other:  All other systems were reviewed with the patient and were negative other that what is mentioned in the HPI.   Physical Examination:   VS: BP 110/78 (BP Location: Left Arm, Cuff Size: Large)   Pulse 85   Resp 16   Ht 5\' 4"  (1.626 m)   Wt (!) 321 lb 12.8 oz (146 kg)   LMP  (LMP Unknown)   SpO2 95%   BMI 55.24 kg/m   General Appearance: No distress  Neuro:without focal findings,  speech normal,  HEENT: PERRLA, EOM intact.  Mallampati 3 Pulmonary: normal breath sounds, No wheezing.  CardiovascularNormal S1,S2.  No m/r/g.   Abdomen: Benign, Soft, non-tender. Renal:  No costovertebral tenderness  GU:  No performed at this time. Endoc: No evident thyromegaly, no signs of acromegaly. Skin:   warm, no rashes, no ecchymosis  Extremities: normal, no cyanosis, clubbing.  Other findings:    LABORATORY PANEL:   CBC No results for input(s): WBC, HGB, HCT, PLT in the last 168 hours. ------------------------------------------------------------------------------------------------------------------  Chemistries  No results for input(s): NA, K, CL, CO2, GLUCOSE, BUN, CREATININE, CALCIUM, MG, AST, ALT, ALKPHOS, BILITOT in the last 168 hours.  Invalid input(s): GFRCGP ------------------------------------------------------------------------------------------------------------------  Cardiac Enzymes No results for input(s): TROPONINI in the last 168 hours. ------------------------------------------------------------  RADIOLOGY:  No results found.     Thank  you for the consultation and for allowing [facility] to assist in the care of your patient. Our recommendations are noted above.  Please contact us if we can be of further service.   Marda Stalker, MD.  Board Certified in Internal Medicine, Pulmonary  Medicine, [place], and Sleep Medicine.  [facility] Office Number: [phone]  Patricia Pesa, M.D.  Merton Border, M.D  08/28/2016

## 2016-08-29 ENCOUNTER — Encounter: Payer: Self-pay | Admitting: Internal Medicine

## 2016-08-29 ENCOUNTER — Ambulatory Visit (INDEPENDENT_AMBULATORY_CARE_PROVIDER_SITE_OTHER): Payer: Managed Care, Other (non HMO) | Admitting: Internal Medicine

## 2016-08-29 VITALS — BP 110/78 | HR 85 | Resp 16 | Ht 64.0 in | Wt 321.8 lb

## 2016-08-29 DIAGNOSIS — R0609 Other forms of dyspnea: Secondary | ICD-10-CM | POA: Diagnosis not present

## 2016-08-29 DIAGNOSIS — G4733 Obstructive sleep apnea (adult) (pediatric): Secondary | ICD-10-CM

## 2016-08-29 DIAGNOSIS — R0600 Dyspnea, unspecified: Secondary | ICD-10-CM

## 2016-08-29 NOTE — Patient Instructions (Addendum)
Will send for sleep study, lung function test and arterial blood gas.     Sleep Apnea Sleep apnea is disorder that affects a person's sleep. A person with sleep apnea has abnormal pauses in their breathing when they sleep. It is hard for them to get a good sleep. This makes a person tired during the day. It also can lead to other physical problems. There are three types of sleep apnea. One type is when breathing stops for a short time because your airway is blocked (obstructive sleep apnea). Another type is when the brain sometimes fails to give the normal signal to breathe to the muscles that control your breathing (central sleep apnea). The third type is a combination of the other two types. HOME CARE   Take all medicine as told by your doctor.  Avoid alcohol, calming medicines (sedatives), and depressant drugs.  Try to lose weight if you are overweight. Talk to your doctor about a healthy weight goal.  Your doctor may have you use a device that helps to open your airway. It can help you get the air that you need. It is called a positive airway pressure (PAP) device.   MAKE SURE YOU:   Understand these instructions.  Will watch your condition.  Will get help right away if you are not doing well or get worse.  It may take approximately 1 month for you to get used to wearing her CPAP every night.  Be sure to work with your machine to get used to it, be patient, it may take time!

## 2016-09-06 ENCOUNTER — Ambulatory Visit: Payer: Managed Care, Other (non HMO) | Admitting: Family Medicine

## 2016-09-27 ENCOUNTER — Telehealth: Payer: Self-pay

## 2016-09-27 DIAGNOSIS — N6452 Nipple discharge: Secondary | ICD-10-CM | POA: Insufficient documentation

## 2016-09-27 DIAGNOSIS — N644 Mastodynia: Secondary | ICD-10-CM

## 2016-09-27 NOTE — Telephone Encounter (Signed)
I have entered new orders that I hope will work for them Thank you

## 2016-09-27 NOTE — Telephone Encounter (Signed)
Patient called states She called to setup mammogram and they said we ordered wrong it needs to be diagnostic mammo 3D with ultrasounds?

## 2016-09-28 ENCOUNTER — Telehealth: Payer: Self-pay

## 2016-09-28 NOTE — Telephone Encounter (Signed)
Left patient detailed voicemail 

## 2016-09-28 NOTE — Telephone Encounter (Signed)
I contacted this patient to inform her that she has been scheduled to have her mammogram on Thursday, October 04, 2016 at 1:40pm at the [facility].   The patient stated that she needed something later so she was given their number [phone]) to call and reschedule.

## 2016-10-04 ENCOUNTER — Other Ambulatory Visit: Payer: Managed Care, Other (non HMO)

## 2016-10-04 ENCOUNTER — Other Ambulatory Visit: Payer: Self-pay | Admitting: Family Medicine

## 2016-10-04 ENCOUNTER — Ambulatory Visit
Admission: RE | Admit: 2016-10-04 | Discharge: 2016-10-04 | Disposition: A | Discharge status: Home / Self Care | Payer: Managed Care, Other (non HMO) | Source: Ambulatory Visit | Attending: Family Medicine | Admitting: Family Medicine

## 2016-10-04 DIAGNOSIS — N644 Mastodynia: Secondary | ICD-10-CM

## 2016-10-04 DIAGNOSIS — R928 Other abnormal and inconclusive findings on diagnostic imaging of breast: Secondary | ICD-10-CM

## 2016-10-04 DIAGNOSIS — N6452 Nipple discharge: Secondary | ICD-10-CM

## 2016-10-04 DIAGNOSIS — N6489 Other specified disorders of breast: Secondary | ICD-10-CM | POA: Diagnosis not present

## 2016-10-11 ENCOUNTER — Other Ambulatory Visit: Payer: Self-pay

## 2016-10-11 DIAGNOSIS — R928 Other abnormal and inconclusive findings on diagnostic imaging of breast: Secondary | ICD-10-CM

## 2016-10-11 DIAGNOSIS — Z803 Family history of malignant neoplasm of breast: Secondary | ICD-10-CM

## 2016-10-11 DIAGNOSIS — N6452 Nipple discharge: Secondary | ICD-10-CM

## 2016-10-17 ENCOUNTER — Telehealth: Payer: Self-pay | Admitting: Family Medicine

## 2016-10-17 ENCOUNTER — Other Ambulatory Visit: Payer: Self-pay | Admitting: Family Medicine

## 2016-10-17 ENCOUNTER — Ambulatory Visit
Admission: RE | Admit: 2016-10-17 | Discharge: 2016-10-17 | Disposition: A | Discharge status: Home / Self Care | Payer: Managed Care, Other (non HMO) | Source: Ambulatory Visit | Attending: Family Medicine | Admitting: Family Medicine

## 2016-10-17 ENCOUNTER — Telehealth: Payer: Self-pay

## 2016-10-17 DIAGNOSIS — R928 Other abnormal and inconclusive findings on diagnostic imaging of breast: Secondary | ICD-10-CM

## 2016-10-17 DIAGNOSIS — N6452 Nipple discharge: Secondary | ICD-10-CM

## 2016-10-17 MED ORDER — LORAZEPAM 0.5 MG PO TABS: ORAL_TABLET | ORAL | 0 refills | Status: DC

## 2016-10-17 NOTE — Telephone Encounter (Signed)
Please clarify; I'm so sorry, but I spoke with her while standing at [employer] and I don't see any documentation

## 2016-10-17 NOTE — Telephone Encounter (Signed)
Done, thank you!

## 2016-10-17 NOTE — Telephone Encounter (Signed)
Pt states that the medication was for anxiety to help her with an biopsy, in which she was not able to due because she did not have the medication. She is asking that you send in the MRI order today pertaining to the biopsy. She is asking to speak with Dr Sanda Klein because she stated that everything should be documented in her chart

## 2016-10-17 NOTE — Telephone Encounter (Signed)
I offered patient my deepest apologies; I recall our conversation but was not at my desk and didn't document and did not send in the medicine; I apologized, no excuses Will send ativan to [facility] in [place]; don't drive for 6 hours after taking; please don't mix with any narcotics or anxiety medicine; may take one for anxiety and repeat 2nd pill 30-60 minutes later if needed; max of 2 We need to order an MRI both breasts Again, I offered my apologies and she thanked me for the call

## 2016-10-17 NOTE — Telephone Encounter (Signed)
[place] called please sign new order

## 2016-10-17 NOTE — Telephone Encounter (Signed)
Requesting return call. States you did not call in the medication that you said you would call in. Uses [facility]. Also requesting that you call in the MRI order today. [phone]

## 2016-10-25 ENCOUNTER — Other Ambulatory Visit: Payer: Self-pay

## 2016-10-25 ENCOUNTER — Telehealth: Payer: Self-pay | Admitting: Family Medicine

## 2016-10-25 ENCOUNTER — Other Ambulatory Visit: Payer: Self-pay | Admitting: Family Medicine

## 2016-10-25 DIAGNOSIS — N6452 Nipple discharge: Secondary | ICD-10-CM

## 2016-10-25 NOTE — Telephone Encounter (Signed)
Suanne Marker has contacted patient that [facility] imaging has scheduled and that she could call them with any concerns

## 2016-10-25 NOTE — Telephone Encounter (Signed)
PT SAID THAT SHE WAS TO GET A MRI AND SHE HAS NOT HEARD ANYTHING BACK. SHE IS VERY UPSET THAT NO ONE HAS CALLED HER . SHE HAS ALREADY HAD THE BIOSPY BUT NOW WAITING TO HEAR FROM SOMEONE ABOUT MRI SET UP

## 2016-11-01 ENCOUNTER — Other Ambulatory Visit: Payer: Self-pay

## 2016-11-01 DIAGNOSIS — R928 Other abnormal and inconclusive findings on diagnostic imaging of breast: Secondary | ICD-10-CM

## 2016-11-04 ENCOUNTER — Ambulatory Visit
Admission: RE | Admit: 2016-11-04 | Discharge: 2016-11-04 | Disposition: A | Discharge status: Home / Self Care | Payer: Managed Care, Other (non HMO) | Source: Ambulatory Visit | Attending: Family Medicine | Admitting: Family Medicine

## 2016-11-04 ENCOUNTER — Other Ambulatory Visit: Payer: Managed Care, Other (non HMO)

## 2016-11-04 MED ORDER — GADOBENATE DIMEGLUMINE 529 MG/ML IV SOLN
20.0000 mL | Freq: Once | INTRAVENOUS | Status: AC | PRN
  Administered 2016-11-04: 20 mL via INTRAVENOUS

## 2016-11-09 ENCOUNTER — Encounter: Payer: Self-pay | Admitting: *Deleted

## 2016-11-09 ENCOUNTER — Telehealth: Payer: Self-pay

## 2016-11-09 DIAGNOSIS — F418 Other specified anxiety disorders: Secondary | ICD-10-CM

## 2016-11-09 MED ORDER — LORAZEPAM 0.5 MG PO TABS: ORAL_TABLET | ORAL | 0 refills | Status: DC

## 2016-11-09 NOTE — Telephone Encounter (Signed)
Discussed results at length with patient and she is agreeable to appointment with Dr. Jamal Collin 11/12/2016 at 3pm - will call their office to confirm. She requests Ativan be resent so that she has some to take prior to biopsy procedure. Advised patient write down any questions she has ahead of time and bring to her appointment.

## 2016-11-09 NOTE — Telephone Encounter (Signed)
Pt is really concern about the result of her breast imaging results.  Could you please look over it.

## 2016-11-12 ENCOUNTER — Encounter: Payer: Self-pay | Admitting: General Surgery

## 2016-11-12 ENCOUNTER — Ambulatory Visit (INDEPENDENT_AMBULATORY_CARE_PROVIDER_SITE_OTHER): Payer: Managed Care, Other (non HMO) | Admitting: General Surgery

## 2016-11-12 ENCOUNTER — Other Ambulatory Visit: Payer: Self-pay | Admitting: General Surgery

## 2016-11-12 VITALS — BP 134/84 | Resp 14 | Ht 64.0 in | Wt 320.0 lb

## 2016-11-12 DIAGNOSIS — R928 Other abnormal and inconclusive findings on diagnostic imaging of breast: Secondary | ICD-10-CM

## 2016-11-12 DIAGNOSIS — N6452 Nipple discharge: Secondary | ICD-10-CM

## 2016-11-12 NOTE — Progress Notes (Signed)
Patient ID: Jasmine Tucker, female   DOB: [birth year], 46 y.o.   MRN: [id number]  Chief Complaint  Patient presents with  . Other    HPI Jasmine Tucker is a 46 y.o. female who presents for a breast evaluation. The most recent mammogram and ultrasound was done on 10/17/2016 . MRI was done on 11/06/2016. Patient nopticed some clear discharge from left breast off and on over the last 3-4 months/ Patient does perform regular self breast checks and gets regular mammograms done.    HPI  Past Medical History:  Diagnosis Date  . Anemia   . Anxiety   . Family history of breast cancer   . Family history of ovarian cancer   . GERD (gastroesophageal reflux disease)   . Menorrhagia   . Plantar fasciitis 2017  . Thrombocytosis ([place])     Past Surgical History:  Procedure Laterality Date  . ABDOMINAL HYSTERECTOMY    . CHOLECYSTECTOMY    . GASTRIC BYPASS  2000  . LAPAROSCOPIC BILATERAL SALPINGECTOMY Bilateral 07/20/2014   Procedure: LAPAROSCOPIC BILATERAL SALPINGECTOMY;  Surgeon: Gae Dry, MD;  Location: [facility];  Service: Gynecology;  Laterality: Bilateral;  . LAPAROSCOPIC HYSTERECTOMY N/A 07/20/2014   Procedure: HYSTERECTOMY TOTAL LAPAROSCOPIC;  Surgeon: Gae Dry, MD;  Location: [facility];  Service: Gynecology;  Laterality: N/A;  . LAPAROSCOPIC OOPHERECTOMY Bilateral 07/20/2014   Procedure: LAPAROSCOPIC OOPHERECTOMY-(POSSIBLE);  Surgeon: Gae Dry, MD;  Location: [facility];  Service: Gynecology;  Laterality: Bilateral;    Family History  Problem Relation Age of Onset  . Ovarian cancer Mother 17  . Breast cancer Maternal Aunt 65  . Cancer Paternal Aunt        either cervical or ovarian cancer  . Cervical cancer Maternal Aunt        reportedly the cause of death was cervical cancer  . Lung cancer Paternal Aunt   . Breast cancer Cousin   . Breast cancer Cousin     Social History Social History  Substance Use Topics  . Smoking status: Never Smoker  . Smokeless  tobacco: Never Used  . Alcohol use Yes     Comment: socially    Allergies  Allergen Reactions  . Latex Itching and Rash    Current Outpatient Prescriptions  Medication Sig Dispense Refill  . Acetaminophen 500 MG coapsule Take by mouth.    . busPIRone (BUSPAR) 15 MG tablet Take 0.5 tablets (7.5 mg total) by mouth 2 (two) times daily as needed. 20 tablet 1  . FLUoxetine (PROZAC) 20 MG tablet Take 1 tablet (20 mg total) by mouth daily. 30 tablet 5  . LORazepam (ATIVAN) 0.5 MG tablet One pill by mouth 60-90 minutes before procedure; may take 1 more pill 30-60 minutes after first pill 2 tablet 0  . meloxicam (MOBIC) 15 MG tablet Take 1 tablet (15 mg total) by mouth daily. 30 tablet 3   No current facility-administered medications for this visit.     Review of Systems Review of Systems  Constitutional: Negative.   Respiratory: Negative.   Cardiovascular: Negative.     Blood pressure 134/84, resp. rate 14, height 5\' 4"  (1.626 m), weight (!) 320 lb (145.2 kg).  Physical Exam Physical Exam  Constitutional: She is oriented to person, place, and time. She appears well-developed and well-nourished.  Eyes: Conjunctivae are normal. No scleral icterus.  Neck: Neck supple.  Cardiovascular: Normal rate, regular rhythm and normal heart sounds.   Pulmonary/Chest: Effort normal and breath sounds normal. Right breast  exhibits no inverted nipple, no mass, no nipple discharge, no skin change and no tenderness. Left breast exhibits nipple discharge ( clear discharge noted from pressure from the lateral aspect of the breast). Left breast exhibits no inverted nipple, no mass, no skin change and no tenderness.  Abdominal: Soft. Bowel sounds are normal. There is no tenderness.  Lymphadenopathy:    She has no cervical adenopathy.    She has no axillary adenopathy.  Neurological: She is alert and oriented to person, place, and time.  Skin: Skin is warm and dry.    Data Reviewed Mammogram, [place] and MRI  reviewed  Mammogram and ultrasound showed some prominent ducts extending out laterally from the nipple area on the left. MRI shows an 8 mm nodule associated   with one of these ducts in the lateral location. Assessment    Left breast clear discharge. 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 Findings were fully discussed with the patient and recommendation for biopsy explained. Patient is agreeable    Patient to have a left breast MRI guided biopsy with clip placement . The patient is aware to call back for any questions or concerns.   HPI, Physical Exam, Assessment and Plan have been scribed under the direction and in the presence of Mckinley Jewel, MD  Gaspar Cola, CMA I have completed the exam and reviewed the above documentation for accuracy and completeness.  I agree with the above.  Haematologist has been used and any errors in dictation or transcription are unintentional.  Seeplaputhur G. Jamal Collin, M.D., F.A.C.S.  Junie Panning G 11/13/2016, 9:25 AM   Patient has been scheduled for an MRI guided left breast biopsy at the [facility] for 11-15-16 at 8:55 am. The patient is aware of date, time, and instructions.   Dominga Ferry, CMA

## 2016-11-12 NOTE — Patient Instructions (Signed)
Patient to have a left breast biopsy

## 2016-11-15 ENCOUNTER — Inpatient Hospital Stay: Admission: RE | Admit: 2016-11-15 | Payer: Managed Care, Other (non HMO) | Source: Ambulatory Visit

## 2016-11-20 ENCOUNTER — Ambulatory Visit: Payer: Managed Care, Other (non HMO)

## 2016-11-20 ENCOUNTER — Ambulatory Visit: Payer: Managed Care, Other (non HMO) | Attending: Internal Medicine

## 2016-11-22 ENCOUNTER — Telehealth: Payer: Self-pay | Admitting: Family Medicine

## 2016-11-22 DIAGNOSIS — F418 Other specified anxiety disorders: Secondary | ICD-10-CM

## 2016-11-22 MED ORDER — LORAZEPAM 0.5 MG PO TABS: ORAL_TABLET | ORAL | 0 refills | Status: DC
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
## 2016-11-22 NOTE — Telephone Encounter (Signed)
Script was faxed.

## 2016-11-26 ENCOUNTER — Ambulatory Visit
Admission: RE | Admit: 2016-11-26 | Discharge: 2016-11-26 | Disposition: A | Discharge status: Home / Self Care | Payer: Managed Care, Other (non HMO) | Source: Ambulatory Visit | Attending: General Surgery | Admitting: General Surgery

## 2016-11-26 DIAGNOSIS — N6452 Nipple discharge: Secondary | ICD-10-CM

## 2016-11-26 DIAGNOSIS — R928 Other abnormal and inconclusive findings on diagnostic imaging of breast: Secondary | ICD-10-CM

## 2016-11-26 MED ORDER — GADOBENATE DIMEGLUMINE 529 MG/ML IV SOLN
20.0000 mL | Freq: Once | INTRAVENOUS | Status: AC | PRN
  Administered 2016-11-26: 20 mL via INTRAVENOUS

## 2016-11-27 ENCOUNTER — Ambulatory Visit: Payer: Managed Care, Other (non HMO) | Admitting: Internal Medicine

## 2016-12-04 ENCOUNTER — Ambulatory Visit: Payer: Managed Care, Other (non HMO) | Admitting: General Surgery

## 2016-12-07 ENCOUNTER — Telehealth: Payer: Self-pay

## 2016-12-07 NOTE — Telephone Encounter (Signed)
Message left for patient to call back to reschedule her appointment with Dr Jamal Collin.

## 2016-12-13 ENCOUNTER — Telehealth: Payer: Self-pay | Admitting: Family Medicine

## 2016-12-13 ENCOUNTER — Other Ambulatory Visit: Payer: Self-pay

## 2016-12-13 MED ORDER — MELOXICAM 15 MG PO TABS: 15.0000 mg | ORAL_TABLET | Freq: Every day | ORAL | 3 refills | Status: DC | PRN

## 2016-12-13 NOTE — Telephone Encounter (Signed)
Pt asking to get her meloxicam. Please call her back.

## 2016-12-13 NOTE — Telephone Encounter (Signed)
Sent to Dr. Sanda Klein already

## 2016-12-18 ENCOUNTER — Telehealth: Payer: Self-pay

## 2016-12-18 NOTE — Telephone Encounter (Signed)
Spoke with the patient about rescheduling he appointment to come in to speak with Dr Jamal Collin about her biopsy results. She said that she would like to speak with Dr Sanda Klein about this first and then would come in to see Dr Jamal Collin. She is calling Dr Delight Ovens office today to get and appointment. She will call us back to reschedule once she has an appointment with her.

## 2017-01-04 NOTE — Telephone Encounter (Signed)
I SPOKE WITH PATIENT ABOUT RESCHEDULING APPOINTMENT WITH DR Jamal Collin TO FOLLOW UP ON BIOPSY RESULTS.SHE STATES SHE WANTED TO TALK TO HER PCP FIRST & HAS NOT MADE THIS APPOINTMENT AT THIS TIME. I ASK HER TO CALL OR WE WOULD CALL HER BACK IN A COUPLE OF WEEKS TO RESCHEDULE.

## 2017-01-17 ENCOUNTER — Encounter: Payer: Self-pay | Admitting: *Deleted

## 2017-02-11 ENCOUNTER — Ambulatory Visit: Payer: Managed Care, Other (non HMO) | Admitting: Family Medicine

## 2017-02-21 ENCOUNTER — Ambulatory Visit: Payer: Managed Care, Other (non HMO) | Admitting: Family Medicine

## 2017-04-02 ENCOUNTER — Other Ambulatory Visit: Payer: Self-pay | Admitting: Family Medicine

## 2017-04-02 NOTE — Telephone Encounter (Signed)
Refills of NSAID not due until around Apr 12, 2017

## 2017-04-05 ENCOUNTER — Telehealth: Payer: Self-pay | Admitting: Family Medicine

## 2017-04-05 MED ORDER — MELOXICAM 15 MG PO TABS: 15.0000 mg | ORAL_TABLET | Freq: Every day | ORAL | 0 refills | Status: DC | PRN

## 2017-04-05 NOTE — Telephone Encounter (Signed)
Copied from [place] [id number]. Topic: Quick Communication - Rx Refill/Question >> Apr 05, 2017  3:19 PM Carolyn Stare wrote: Medication meloxicam (MOBIC) 15 MG tablet  There are no refills   Preferred Pharmacy  [facility] [address] [facility] [place]   Agent: Please be advised that RX refills may take up to 3 business days. We ask that you follow-up with your pharmacy.

## 2017-04-05 NOTE — Telephone Encounter (Signed)
Patient is overdue for an appointment please Contact her and ask her to schedule a visit I'll allow one refill of medicine, and we'll want to see her for further refills Thank you

## 2017-04-09 ENCOUNTER — Ambulatory Visit (INDEPENDENT_AMBULATORY_CARE_PROVIDER_SITE_OTHER): Payer: Managed Care, Other (non HMO) | Admitting: Family Medicine

## 2017-04-09 ENCOUNTER — Encounter: Payer: Self-pay | Admitting: Family Medicine

## 2017-04-09 VITALS — BP 116/80 | HR 76 | Temp 97.9°F | Resp 14 | Wt 318.1 lb

## 2017-04-09 DIAGNOSIS — R928 Other abnormal and inconclusive findings on diagnostic imaging of breast: Secondary | ICD-10-CM

## 2017-04-09 DIAGNOSIS — N6452 Nipple discharge: Secondary | ICD-10-CM | POA: Diagnosis not present

## 2017-04-09 DIAGNOSIS — F419 Anxiety disorder, unspecified: Secondary | ICD-10-CM

## 2017-04-09 DIAGNOSIS — Z791 Long term (current) use of non-steroidal anti-inflammatories (NSAID): Secondary | ICD-10-CM | POA: Diagnosis not present

## 2017-04-09 DIAGNOSIS — M722 Plantar fascial fibromatosis: Secondary | ICD-10-CM | POA: Diagnosis not present

## 2017-04-09 MED ORDER — FLUOXETINE HCL 20 MG PO CAPS: 20.0000 mg | ORAL_CAPSULE | Freq: Every day | ORAL | 3 refills | Status: DC

## 2017-04-09 MED ORDER — RANITIDINE HCL 150 MG PO TABS: 150.0000 mg | ORAL_TABLET | Freq: Every day | ORAL | 11 refills | Status: DC

## 2017-04-09 MED ORDER — MELOXICAM 15 MG PO TABS: 15.0000 mg | ORAL_TABLET | Freq: Every day | ORAL | 5 refills | Status: DC | PRN

## 2017-04-09 NOTE — Progress Notes (Signed)
BP 116/80   Pulse 76   Temp 97.9 F (36.6 C) (Oral)   Resp 14   Wt (!) 318 lb 1.6 oz (144.3 kg)   LMP  (LMP Unknown)   SpO2 94%   BMI 54.60 kg/m    Subjective:    Patient ID: Jasmine Tucker, female    DOB: 1970/08/28, 47 y.o.   MRN: [id number]  HPI: Jasmine Tucker is a 47 y.o. female  Chief Complaint  Patient presents with  . Medication Refill  . Breast Problem    review imaging    HPI She has plantar fasciitis; bilateral; uses ice and wears splints and takes meloxicam When she misses 3 doses, then flared back up She has pain in the bottom of her feet Saw podiatrist, Dr. Milinda Pointer; he did injections On the medicine, no blood in the stool; has acid reflux, even before the meloxicam  She has had abnormal breast imaging and then biopsy She has not felt any lumps or bumps She had the biopsy because she was having nipple drainage She was told it was a duct causing the leakage or drainage Dr. Jamal Collin was looking after her but he has retired now Architectural technologist and first cousins with breast cancer I offered breast exam and she agreed today  Diag mammo bilateral 10/04/16: ADDENDUM: The patient presented on 10/17/2016 for ultrasound-guided biopsy of the area of shadowing seen at 2 o'clock, 10 cm from the nipple within the left breast. Targeted ultrasound of the upper, outer left breast was performed; however, the area of shadowing was unable to be definitively identified to allow for biopsy.  Tomosynthesis guided biopsy of the asymmetry described in the upper, outer left breast on recent diagnostic exam was then considered. On repeat full paddle CC view of the left breast, the possible asymmetry seen on recent diagnostic mammogram persisted but was less prominent. A CC spot compression view of the lateral breast was also performed as it was unclear whether the asymmetry had been included in the initial CC spot compression view. On today's spot compression view, the asymmetry seen  mammographically in the lateral left breast completely disperses and is favored to represent normal breast tissue. It was felt that this asymmetry was unlikely to be seen to allow for tomosynthesis guided biopsy.  A bilateral breast MRI with contrast is recommended for further evaluation of the patient's left nipple discharge and for high risk screening as the patient has a significant family history of breast cancer in an aunt and two cousins and a family history of ovarian cancer in her mother. If the breast MRI is negative, a six-month follow-up left diagnostic mammogram would be recommended for the asymmetry described above. In addition, a surgical consultation would be recommended for the patient's left nipple discharge.   Electronically Signed   By: Pamelia Hoit M.D.   On: 10/17/2016 13:54  MR breast 11/04/16: IMPRESSION: 1. Dilated duct system associated with an 8 mm enhancing mass in the upper-outer quadrant of the left breast, suspicious for papilloma. 2. Tissue diagnosis is recommended. 3. Right breast is negative. 4. No suspicious adenopathy.  RECOMMENDATION: MR guided core biopsy of left breast mass is recommended.  BI-RADS CATEGORY  4: Suspicious.   Electronically Signed   By: Nolon Nations M.D.   On: 11/06/2016 09:10  Only uses lorazepam sparingly for dentist appointments Not on prozac $78 a month; it was helping and would like try if less $$; yes  Depression screen [facility] 2/9 04/09/2017 08/10/2016  11/03/2015 10/18/2014  Decreased Interest 0 0 0 0  Down, Depressed, Hopeless 0 0 1 0  PHQ - 2 Score 0 0 1 0  Altered sleeping - - - 2  Tired, decreased energy - - - 0  Change in appetite - - - 0  Feeling bad or failure about yourself  - - - 0  Trouble concentrating - - - 0  Moving slowly or fidgety/restless - - - 0  Suicidal thoughts - - - 0  PHQ-9 Score - - - 2    Relevant past medical, surgical, family and social history reviewed Past Medical History:    Diagnosis Date  . Anemia   . Anxiety   . Family history of breast cancer   . Family history of ovarian cancer   . GERD (gastroesophageal reflux disease)   . Menorrhagia   . Plantar fasciitis 2017  . Thrombocytosis ([place])    Past Surgical History:  Procedure Laterality Date  . ABDOMINAL HYSTERECTOMY    . BREAST SURGERY     breast biopsy 4 months ago, left breast  . CHOLECYSTECTOMY    . GASTRIC BYPASS  2000  . LAPAROSCOPIC BILATERAL SALPINGECTOMY Bilateral 07/20/2014   Procedure: LAPAROSCOPIC BILATERAL SALPINGECTOMY;  Surgeon: Gae Dry, MD;  Location: [facility];  Service: Gynecology;  Laterality: Bilateral;  . LAPAROSCOPIC HYSTERECTOMY N/A 07/20/2014   Procedure: HYSTERECTOMY TOTAL LAPAROSCOPIC;  Surgeon: Gae Dry, MD;  Location: [facility];  Service: Gynecology;  Laterality: N/A;  . LAPAROSCOPIC OOPHERECTOMY Bilateral 07/20/2014   Procedure: LAPAROSCOPIC OOPHERECTOMY-(POSSIBLE);  Surgeon: Gae Dry, MD;  Location: [facility];  Service: Gynecology;  Laterality: Bilateral;   Family History  Problem Relation Age of Onset  . Ovarian cancer Mother 26  . Breast cancer Maternal Aunt 65  . Cancer Paternal Aunt        either cervical or ovarian cancer  . Cervical cancer Maternal Aunt        reportedly the cause of death was cervical cancer  . Lung cancer Paternal Aunt   . Breast cancer Cousin   . Breast cancer Cousin    Social History   Tobacco Use  . Smoking status: Never Smoker  . Smokeless tobacco: Never Used  Substance Use Topics  . Alcohol use: Yes    Comment: socially  . Drug use: No   Interim medical history since last visit reviewed. Allergies and medications reviewed  Review of Systems Per HPI unless specifically indicated above     Objective:    BP 116/80   Pulse 76   Temp 97.9 F (36.6 C) (Oral)   Resp 14   Wt (!) 318 lb 1.6 oz (144.3 kg)   LMP  (LMP Unknown)   SpO2 94%   BMI 54.60 kg/m   Wt Readings from Last 3 Encounters:  04/09/17 (!)  318 lb 1.6 oz (144.3 kg)  11/12/16 (!) 320 lb (145.2 kg)  08/29/16 (!) 321 lb 12.8 oz (146 kg)    Physical Exam  Constitutional: She appears well-developed and well-nourished.  Morbidly obese  HENT:  Mouth/Throat: Mucous membranes are normal.  Eyes: EOM are normal. No scleral icterus.  Cardiovascular: Normal rate and regular rhythm.  Pulmonary/Chest: Effort normal and breath sounds normal. Right breast exhibits no inverted nipple, no mass, no nipple discharge, no skin change and no tenderness. Left breast exhibits no inverted nipple, no mass, no nipple discharge, no skin change and no tenderness.  Psychiatric: She has a normal mood and affect. Her behavior is  normal.    Results for orders placed or performed in visit on 08/10/16  TSH  Result Value Ref Range   TSH 0.46 mIU/L  T4, free  Result Value Ref Range   Free T4 1.2 0.8 - 1.8 ng/dL  Prolactin  Result Value Ref Range   Prolactin 7.5 ng/mL  CBC with Differential/Platelet  Result Value Ref Range   WBC 6.3 3.8 - 10.8 K/uL   RBC 5.21 (H) 3.80 - 5.10 MIL/uL   Hemoglobin 13.4 11.7 - 15.5 g/dL   HCT 41.8 35.0 - 45.0 %   MCV 80.2 80.0 - 100.0 fL   MCH 25.7 (L) 27.0 - 33.0 pg   MCHC 32.1 32.0 - 36.0 g/dL   RDW 15.1 (H) 11.0 - 15.0 %   Platelets 276 140 - 400 K/uL   MPV 9.7 7.5 - 12.5 fL   Neutro Abs 3,276 1,500 - 7,800 cells/uL   Lymphs Abs 2,268 850 - 3,900 cells/uL   Monocytes Absolute 504 200 - 950 cells/uL   Eosinophils Absolute 189 15 - 500 cells/uL   Basophils Absolute 63 0 - 200 cells/uL   Neutrophils Relative % 52 %   Lymphocytes Relative 36 %   Monocytes Relative 8 %   Eosinophils Relative 3 %   Basophils Relative 1 %   Smear Review Criteria for review not met   COMPLETE METABOLIC PANEL WITH GFR  Result Value Ref Range   Sodium 138 135 - 146 mmol/L   Potassium 4.3 3.5 - 5.3 mmol/L   Chloride 107 98 - 110 mmol/L   CO2 26 20 - 31 mmol/L   Glucose, Bld 77 65 - 99 mg/dL   BUN 16 7 - 25 mg/dL   Creat 0.48 (L)  0.50 - 1.10 mg/dL   Total Bilirubin 0.4 0.2 - 1.2 mg/dL   Alkaline Phosphatase 83 33 - 115 U/L   AST 22 10 - 35 U/L   ALT 17 6 - 29 U/L   Total Protein 6.8 6.1 - 8.1 g/dL   Albumin 4.0 3.6 - 5.1 g/dL   Calcium 9.1 8.6 - 10.2 mg/dL   GFR, Est African American >89 >=60 mL/min   GFR, Est Non African American >89 >=60 mL/min  VITAMIN D 25 Hydroxy (Vit-D Deficiency, Fractures)  Result Value Ref Range   Vit D, 25-Hydroxy 34 30 - 100 ng/mL  Vitamin B12  Result Value Ref Range   Vitamin B-12 604 200 - 1,100 pg/mL      Assessment & Plan:   Problem List Items Addressed This Visit      Musculoskeletal and Integument   Plantar fasciitis, bilateral    Rx provided for meloxicam; patient will continue the splints and ice        Other   Nipple discharge    Ordering bilateral diag mammo today      Relevant Orders   MM Digital Diagnostic Bilat   Anxiety    Patient uses benzo only for dental appointments; will start her back on the SSRI since she found that helpful; I'll send the capsules rather than tablets which I understand are less expensive      Relevant Medications   FLUoxetine (PROZAC) 20 MG capsule   Abnormal MRI, breast - Primary    Order bilateral diag mammo per last recommendation      Relevant Orders   MM Digital Diagnostic Bilat    Other Visit Diagnoses    NSAID long-term use       will start H2  blocker since she takes NSAID chronically; advised that NSAIDs over time can damage kidneys       Follow up plan: No Follow-up on file.  An after-visit summary was printed and given to the patient at [place].  Please see the patient instructions which may contain other information and recommendations beyond what is mentioned above in the assessment and plan.  Meds ordered this encounter  Medications  . ranitidine (ZANTAC) 150 MG tablet    Sig: Take 1 tablet (150 mg total) by mouth at bedtime.    Dispense:  30 tablet    Refill:  11  . meloxicam (MOBIC) 15 MG tablet      Sig: Take 1 tablet (15 mg total) by mouth daily as needed for pain.    Dispense:  30 tablet    Refill:  5  . FLUoxetine (PROZAC) 20 MG capsule    Sig: Take 1 capsule (20 mg total) by mouth daily.    Dispense:  90 capsule    Refill:  3    Orders Placed This Encounter  Procedures  . MM Digital Diagnostic Bilat

## 2017-04-09 NOTE — Patient Instructions (Signed)
We'll get the breast imaging Do try to do your own self-breast exam every month Let us know of any changes or nipple discharge Good luck on your weight loss journey

## 2017-04-09 NOTE — Assessment & Plan Note (Signed)
Rx provided for meloxicam; patient will continue the splints and ice

## 2017-04-09 NOTE — Assessment & Plan Note (Signed)
Patient uses benzo only for dental appointments; will start her back on the SSRI since she found that helpful; I'll send the capsules rather than tablets which I understand are less expensive

## 2017-04-09 NOTE — Assessment & Plan Note (Signed)
Order bilateral diag mammo per last recommendation

## 2017-04-09 NOTE — Assessment & Plan Note (Signed)
Ordering bilateral diag mammo today

## 2017-04-20 NOTE — Progress Notes (Signed)
Closing out order document

## 2017-04-20 NOTE — Telephone Encounter (Signed)
Closing out open phone note Patient has been seen

## 2017-06-12 ENCOUNTER — Telehealth: Payer: Self-pay | Admitting: Family Medicine

## 2017-06-12 ENCOUNTER — Other Ambulatory Visit: Payer: Self-pay | Admitting: Family Medicine

## 2017-06-12 NOTE — Telephone Encounter (Signed)
Copied from [place] [id number] [id number]. Topic: Quick Communication - Rx Refill/Question >> Jun 12, 2017  3:32 PM Carolyn Stare wrote: Medication  busPIRone (BUSPAR) 15 MG tablet  Has the patient contacted their pharmacy yes    Preferred Pharmacy   [facility] [place]    Agent: Please be advised that RX refills may take up to 3 business days. We ask that you follow-up with your pharmacy.

## 2017-06-13 NOTE — Telephone Encounter (Signed)
Already sent in yesterday

## 2017-09-10 ENCOUNTER — Telehealth: Payer: Self-pay | Admitting: Family Medicine

## 2017-09-10 NOTE — Telephone Encounter (Signed)
Copied from [place] [id number]. Topic: Quick Communication - See Telephone Encounter >> Sep 10, 2017  5:57 PM Neva Seat wrote: Needing 2 weeks supply of  LORazepam (ATIVAN) 0.5 MG tablet because pt is going out of town. She is needing this for an airplane trip.

## 2017-09-11 NOTE — Telephone Encounter (Signed)
She would need an appt for this

## 2017-09-11 NOTE — Telephone Encounter (Signed)
Spoke with pt and appt scheduled for 09-26-17 (your first availability). Did not schedule with Benjamine Mola or Raquel Sarna because I was not certain if they can prescribe what the patient is requesting.

## 2017-09-26 ENCOUNTER — Ambulatory Visit (INDEPENDENT_AMBULATORY_CARE_PROVIDER_SITE_OTHER): Payer: PRIVATE HEALTH INSURANCE | Admitting: Family Medicine

## 2017-09-26 ENCOUNTER — Encounter: Payer: Self-pay | Admitting: Family Medicine

## 2017-09-26 VITALS — BP 108/64 | HR 76 | Temp 97.9°F | Resp 16 | Ht 64.0 in | Wt 307.2 lb

## 2017-09-26 DIAGNOSIS — F418 Other specified anxiety disorders: Secondary | ICD-10-CM

## 2017-09-26 MED ORDER — BUSPIRONE HCL 15 MG PO TABS: 7.5000 mg | ORAL_TABLET | Freq: Two times a day (BID) | ORAL | 1 refills | Status: DC | PRN

## 2017-09-26 MED ORDER — INSULIN PEN NEEDLE 31G X 8 MM MISC: 2 refills | Status: DC

## 2017-09-26 MED ORDER — LORAZEPAM 0.5 MG PO TABS: ORAL_TABLET | ORAL | 0 refills | Status: DC

## 2017-09-26 MED ORDER — MELOXICAM 15 MG PO TABS: 15.0000 mg | ORAL_TABLET | Freq: Every day | ORAL | 5 refills | Status: DC | PRN

## 2017-09-26 MED ORDER — LIRAGLUTIDE -WEIGHT MANAGEMENT 18 MG/3ML ~~LOC~~ SOPN: 0.6000 mg | PEN_INJECTOR | Freq: Every day | SUBCUTANEOUS | 0 refills | Status: DC

## 2017-09-26 MED ORDER — DICLOFENAC SODIUM 1 % TD GEL: TRANSDERMAL | 2 refills | Status: DC

## 2017-09-26 NOTE — Patient Instructions (Addendum)
Check out the information at familydoctor.org entitled "Nutrition for Weight Loss: What You Need to Know about Fad Diets" Try to lose between 1-2 pounds per week by taking in fewer calories and burning off more calories You can succeed by limiting portions, limiting foods dense in calories and fat, becoming more active, and drinking 8 glasses of water a day (64 ounces) Don't skip meals, especially breakfast, as skipping meals may alter your metabolism Do not use over-the-counter weight loss pills or gimmicks that claim rapid weight loss A healthy BMI (or body mass index) is between 18.5 and 24.9 You can calculate your ideal BMI at the [place] website ClubMonetize.fr  Do see the nutritionist Start the Saxenda   Liraglutide injection (Weight Management) What is this medicine? LIRAGLUTIDE (LIR a GLOO tide) is used with a reduced calorie diet and exercise to help you lose weight. This medicine may be used for other purposes; ask your health care provider or pharmacist if you have questions. COMMON BRAND NAME(S): Saxenda What should I tell my health care provider before I take this medicine? They need to know if you have any of these conditions: -endocrine tumors (MEN 2) or if someone in your family had these tumors -gallbladder disease -high cholesterol -history of alcohol abuse problem -history of pancreatitis -kidney disease or if you are on dialysis -liver disease -previous swelling of the tongue, face, or lips with difficulty breathing, difficulty swallowing, hoarseness, or tightening of the throat -stomach problems -suicidal thoughts, plans, or attempt; a previous suicide attempt by you or a family member -thyroid cancer or if someone in your family had thyroid cancer -an unusual or allergic reaction to liraglutide, other medicines, foods, dyes, or preservatives -pregnant or trying to get pregnant -breast-feeding How should I use this  medicine? This medicine is for injection under the skin of your upper leg, stomach area, or upper arm. You will be taught how to prepare and give this medicine. Use exactly as directed. Take your medicine at regular intervals. Do not take it more often than directed. It is important that you put your used needles and syringes in a special sharps container. Do not put them in a trash can. If you do not have a sharps container, call your pharmacist or healthcare provider to get one. A special MedGuide will be given to you by the pharmacist with each prescription and refill. Be sure to read this information carefully each time. Talk to your pediatrician regarding the use of this medicine in children. Special care may be needed. Overdosage: If you think you have taken too much of this medicine contact a poison control center or emergency room at once. NOTE: This medicine is only for you. Do not share this medicine with others. What if I miss a dose? If you miss a dose, take it as soon as you can. If it is almost time for your next dose, take only that dose. Do not take double or extra doses. If you miss your dose for 3 days or more, call your doctor or health care professional to talk about how to restart this medicine. What may interact with this medicine? -insulin and other medicines for diabetes This list may not describe all possible interactions. Give your health care provider a list of all the medicines, herbs, non-prescription drugs, or dietary supplements you use. Also tell them if you smoke, drink alcohol, or use illegal drugs. Some items may interact with your medicine. What should I watch for while using this medicine?  Visit your doctor or health care professional for regular checks on your progress. This medicine is intended to be used in addition to a healthy diet and appropriate exercise. The best results are achieved this way. Do not increase or in any way change your dose without consulting  your doctor or health care professional. Drink plenty of fluids while taking this medicine. Check with your doctor or health care professional if you get an attack of severe diarrhea, nausea, and vomiting. The loss of too much body fluid can make it dangerous for you to take this medicine. This medicine may affect blood sugar levels. If you have diabetes, check with your doctor or health care professional before you change your diet or the dose of your diabetic medicine. Patients and their families should watch out for worsening depression or thoughts of suicide. Also watch out for sudden changes in feelings such as feeling anxious, agitated, panicky, irritable, hostile, aggressive, impulsive, severely restless, overly excited and hyperactive, or not being able to sleep. If this happens, especially at the beginning of treatment or after a change in dose, call your health care professional. What side effects may I notice from receiving this medicine? Side effects that you should report to your doctor or health care professional as soon as possible: -allergic reactions like skin rash, itching or hives, swelling of the face, lips, or tongue -breathing problems -diarrhea that continues or is severe -lump or swelling on the neck -severe nausea -signs and symptoms of infection like fever or chills; cough; sore throat; pain or trouble passing urine -signs and symptoms of low blood sugar such as feeling anxious, confusion, dizziness, increased hunger, unusually weak or tired, sweating, shakiness, cold, irritable, headache, blurred vision, fast heartbeat, loss of consciousness -signs and symptoms of kidney injury like trouble passing urine or change in the amount of urine -trouble swallowing -unusual stomach upset or pain -vomiting Side effects that usually do not require medical attention (report to your doctor or health care professional if they continue or are bothersome): -constipation -decreased  appetite -diarrhea -fatigue -headache -nausea -pain, redness, or irritation at site where injected -stomach upset -stuffy or runny nose This list may not describe all possible side effects. Call your doctor for medical advice about side effects. You may report side effects to FDA at 1-800-FDA-1088. Where should I keep my medicine? Keep out of the reach of children. Store unopened pen in a refrigerator between 2 and 8 degrees C (36 and 46 degrees F). Do not freeze or use if the medicine has been frozen. Protect from light and excessive heat. After you first use the pen, it can be stored at room temperature between 15 and 30 degrees C (59 and 86 degrees F) or in a refrigerator. Throw away your used pen after 30 days or after the expiration date, whichever comes first. Do not store your pen with the needle attached. If the needle is left on, medicine may leak from the pen. NOTE: This sheet is a summary. It may not cover all possible information. If you have questions about this medicine, talk to your doctor, pharmacist, or health care provider.  2018 Elsevier/Gold Standard (2016-03-08 14:41:37)  Preventing Unhealthy Weight Gain, Adult Staying at a healthy weight is important. When fat builds up in your body, you may become overweight or obese. These conditions put you at greater risk for developing certain health problems, such as heart disease, diabetes, sleeping problems, joint problems, and some cancers. Unhealthy weight gain is often the  result of making unhealthy choices in what you eat. It is also a result of not getting enough exercise. You can make changes to your lifestyle to prevent obesity and stay as healthy as possible. What nutrition changes can be made? To maintain a healthy weight and prevent obesity:  Eat only as much as your body needs. To do this: ? Pay attention to signs that you are hungry or full. Stop eating as soon as you feel full. ? If you feel hungry, try drinking  water first. Drink enough water so your urine is clear or pale yellow. ? Eat smaller portions. ? Look at serving sizes on food labels. Most foods contain more than one serving per container. ? Eat the recommended amount of calories for your gender and activity level. While most active people should eat around 2,000 calories per day, if you are trying to lose weight or are not very active, you main need to eat less calories. Talk to your health care provider or dietitian about how many calories you should eat each day.  Choose healthy foods, such as: ? Fruits and vegetables. Try to fill at least half of your plate at each meal with fruits and vegetables. ? Whole grains, such as whole wheat bread, brown rice, and quinoa. ? Lean meats, such as chicken or fish. ? Other healthy proteins, such as beans, eggs, or tofu. ? Healthy fats, such as nuts, seeds, fatty fish, and olive oil. ? Low-fat or fat-free dairy.  Check food labels and avoid food and drinks that: ? Are high in calories. ? Have added sugar. ? Are high in sodium. ? Have saturated fats or trans fats.  Limit how much you eat of the following foods: ? Prepackaged meals. ? Fast food. ? Fried foods. ? Processed meat, such as bacon, sausage, and deli meats. ? Fatty cuts of red meat and poultry with skin.  Cook foods in healthier ways, such as by baking, broiling, or grilling.  When grocery shopping, try to shop around the outside of the store. This helps you buy mostly fresh foods and avoid canned and prepackaged foods.  What lifestyle changes can be made?  Exercise at least 30 minutes 5 or more days each week. Exercising includes brisk walking, yard work, biking, running, swimming, and team sports like basketball and soccer. Ask your health care provider which exercises are safe for you.  Do not use any products that contain nicotine or tobacco, such as cigarettes and e-cigarettes. If you need help quitting, ask your health care  provider.  Limit alcohol intake to no more than 1 drink a day for nonpregnant women and 2 drinks a day for men. One drink equals 12 oz of beer, 5 oz of wine, or 1 oz of hard liquor.  Try to get 7-9 hours of sleep each night. What other changes can be made?  Keep a food and activity journal to keep track of: ? What you ate and how many calories you had. Remember to count sauces, dressings, and side dishes. ? Whether you were active, and what exercises you did. ? Your calorie, weight, and activity goals.  Check your weight regularly. Track any changes. If you notice you have gained weight, make changes to your diet or activity routine.  Avoid taking weight-loss medicines or supplements. Talk to your health care provider before starting any new medicine or supplement.  Talk to your health care provider before trying any new diet or exercise plan. Why are these changes  important? Eating healthy, staying active, and having healthy habits not only help prevent obesity, they also:  Help you to manage stress and emotions.  Help you to connect with friends and family.  Improve your self-esteem.  Improve your sleep.  Prevent long-term health problems.  What can happen if changes are not made? Being obese or overweight can cause you to develop joint or bone problems, which can make it hard for you to stay active or do activities you enjoy. Being obese or overweight also puts stress on your heart and lungs and can lead to health problems like diabetes, heart disease, and some cancers. Where to find more information: Talk with your health care provider or a dietitian about healthy eating and healthy lifestyle choices. You may also find other information through these resources:  U.S. Department of Agriculture MyPlate: FormerBoss.no  American Heart Association: www.heart.org  Centers for Disease Control and Prevention: http://www.wolf.info/  Summary  Staying at a healthy weight is  important. It helps prevent certain diseases and health problems, such as heart disease, diabetes, joint problems, sleep disorders, and some cancers.  Being obese or overweight can cause you to develop joint or bone problems, which can make it hard for you to stay active or do activities you enjoy.  You can prevent unhealthy weight gain by eating a healthy diet, exercising regularly, not smoking, limiting alcohol, and getting enough sleep.  Talk with your health care provider or a dietitian for guidance about healthy eating and healthy lifestyle choices. This information is not intended to replace advice given to you by your health care provider. Make sure you discuss any questions you have with your health care provider. Document Released: 02/21/2016 Document Revised: 03/28/2016 Document Reviewed: 03/28/2016 Elsevier Interactive Patient Education  [employer].

## 2017-09-26 NOTE — Progress Notes (Signed)
BP 108/64 (BP Location: Right Arm, Patient Position: Sitting, Cuff Size: Large)   Pulse 76   Temp 97.9 F (36.6 C) (Oral)   Resp 16   Ht 5\' 4"  (1.626 m)   Wt (!) 307 lb 3.2 oz (139.3 kg)   LMP  (LMP Unknown)   SpO2 96%   BMI 52.73 kg/m    Subjective:    Patient ID: Jasmine Tucker, female    DOB: 1970-03-24, 47 y.o.   MRN: [id number]  HPI: KEYRI SALBERG is a 47 y.o. female  Chief Complaint  Patient presents with  . Medication Refill    HPI She is here for f/u She is flying to [place] soon and needs something for the flight PMP aware reviewed; no red flags Having some daily anxiety; talks herself down; does not want the daily fluoxetine Okay to use buspirone when needed Plantar fasciitis; using meloxicam and voltaren gel  She asked about Saxenda for morbid obesity She is trying to watch her weight; was trying to keep her calories under 1000 a day; lost 30 pounds, but has been stuck there; not walking regular; walks at work, just at work, up all day; no sugary drinks; not a big water drinker  She had an abscess lanced by dermatologist; asked about staple or something there  Depression screen [facility] 2/9 09/26/2017 04/09/2017 08/10/2016 11/03/2015 10/18/2014  Decreased Interest 0 0 0 0 0  Down, Depressed, Hopeless 1 0 0 1 0  PHQ - 2 Score 1 0 0 1 0  Altered sleeping 1 - - - 2  Tired, decreased energy 0 - - - 0  Change in appetite 0 - - - 0  Feeling bad or failure about yourself  0 - - - 0  Trouble concentrating 0 - - - 0  Moving slowly or fidgety/restless 0 - - - 0  Suicidal thoughts 0 - - - 0  PHQ-9 Score 2 - - - 2  Difficult doing work/chores Not difficult at all - - - -    Relevant past medical, surgical, family and social history reviewed Past Medical History:  Diagnosis Date  . Anemia   . Anxiety   . Family history of breast cancer   . Family history of ovarian cancer   . GERD (gastroesophageal reflux disease)   . Menorrhagia   . Plantar fasciitis 2017  .  Thrombocytosis ([place])    Past Surgical History:  Procedure Laterality Date  . ABDOMINAL HYSTERECTOMY    . BREAST SURGERY     breast biopsy 4 months ago, left breast  . CHOLECYSTECTOMY    . GASTRIC BYPASS  2000  . LAPAROSCOPIC BILATERAL SALPINGECTOMY Bilateral 07/20/2014   Procedure: LAPAROSCOPIC BILATERAL SALPINGECTOMY;  Surgeon: Gae Dry, MD;  Location: [facility] ORS;  Service: Gynecology;  Laterality: Bilateral;  . LAPAROSCOPIC HYSTERECTOMY N/A 07/20/2014   Procedure: HYSTERECTOMY TOTAL LAPAROSCOPIC;  Surgeon: Gae Dry, MD;  Location: [facility] ORS;  Service: Gynecology;  Laterality: N/A;  . LAPAROSCOPIC OOPHERECTOMY Bilateral 07/20/2014   Procedure: LAPAROSCOPIC OOPHERECTOMY-(POSSIBLE);  Surgeon: Gae Dry, MD;  Location: [facility] ORS;  Service: Gynecology;  Laterality: Bilateral;   Family History  Problem Relation Age of Onset  . Ovarian cancer Mother 15  . Breast cancer Maternal Aunt 65  . Cancer Paternal Aunt        either cervical or ovarian cancer  . Cervical cancer Maternal Aunt        reportedly the cause of death was cervical cancer  .  Lung cancer Paternal Aunt   . Breast cancer Cousin   . Breast cancer Cousin    Social History   Tobacco Use  . Smoking status: Never Smoker  . Smokeless tobacco: Never Used  Substance Use Topics  . Alcohol use: Yes    Comment: socially  . Drug use: No    Interim medical history since last visit reviewed. Allergies and medications reviewed  Review of Systems Per HPI unless specifically indicated above     Objective:    BP 108/64 (BP Location: Right Arm, Patient Position: Sitting, Cuff Size: Large)   Pulse 76   Temp 97.9 F (36.6 C) (Oral)   Resp 16   Ht 5\' 4"  (1.626 m)   Wt (!) 307 lb 3.2 oz (139.3 kg)   LMP  (LMP Unknown)   SpO2 96%   BMI 52.73 kg/m   Wt Readings from Last 3 Encounters:  09/26/17 (!) 307 lb 3.2 oz (139.3 kg)  04/09/17 (!) 318 lb 1.6 oz (144.3 kg)  11/12/16 (!) 320 lb (145.2 kg)    Physical  Exam  Constitutional: She appears well-developed and well-nourished.  Morbidly obese, weight loss noted  HENT:  Mouth/Throat: Mucous membranes are normal.  Eyes: EOM are normal. No scleral icterus.  Cardiovascular: Normal rate and regular rhythm.  Pulmonary/Chest: Effort normal and breath sounds normal.  Psychiatric: She has a normal mood and affect. Her behavior is normal.      Assessment & Plan:   Problem List Items Addressed This Visit      Other   Morbid obesity ([place])    Encouragement given; see AVS; discussed saxenda; no MTC or fam hx of MEN-2; refer to nutritionist      Relevant Medications   Liraglutide -Weight Management (SAXENDA) 18 MG/3ML SOPN   Other Relevant Orders   Amb ref to Medical Nutrition Therapy-MNT    Other Visit Diagnoses    Situational anxiety    -  Primary   PMP Aware reviewed; allow limited benzos for flying, anxiety associated with travel; cautioned about NO alcohol with this   Relevant Medications   LORazepam (ATIVAN) 0.5 MG tablet   busPIRone (BUSPAR) 15 MG tablet       Follow up plan: Return in about 6 weeks (around 11/07/2017) for follow-up visit with Dr. Sanda Klein.  An after-visit summary was printed and given to the patient at [facility].  Please see the patient instructions which may contain other information and recommendations beyond what is mentioned above in the assessment and plan.  Meds ordered this encounter  Medications  . meloxicam (MOBIC) 15 MG tablet    Sig: Take 1 tablet (15 mg total) by mouth daily as needed for pain.    Dispense:  30 tablet    Refill:  5  . LORazepam (ATIVAN) 0.5 MG tablet    Sig: One pill by mouth 60-90 minutes before procedure; may take 1 more pill 30-60 minutes after first pill; no alcohol with this    Dispense:  7 tablet    Refill:  0    [facility]  . busPIRone (BUSPAR) 15 MG tablet    Sig: Take 0.5-1 tablets (7.5-15 mg total) by mouth 2 (two) times daily as needed.    Dispense:  30 tablet     Refill:  1  . diclofenac sodium (VOLTAREN) 1 % GEL    Sig: Apply small amount to affected area up to four times a day    Dispense:  100 g  Refill:  2  . Liraglutide -Weight Management (SAXENDA) 18 MG/3ML SOPN    Sig: Inject 0.6 mg into the skin daily. x 1 week, then 1.2 mg daily x 1 week, then 1.8 mg daily x 1 week, then 2.4 mg daily x 1 week, then 3 mg daily    Dispense:  3 mL    Refill:  0  . Insulin Pen Needle 31G X 8 MM MISC    Sig: For use with Saxenda, daily injections    Dispense:  30 each    Refill:  2    Orders Placed This Encounter  Procedures  . Amb ref to Medical Nutrition Therapy-MNT

## 2017-10-07 NOTE — Assessment & Plan Note (Signed)
Encouragement given; see AVS; discussed saxenda; no MTC or fam hx of MEN-2; refer to nutritionist

## 2017-10-17 ENCOUNTER — Other Ambulatory Visit: Payer: Self-pay | Admitting: Family Medicine

## 2017-10-17 ENCOUNTER — Telehealth: Payer: Self-pay | Admitting: Family Medicine

## 2017-10-17 NOTE — Telephone Encounter (Signed)
I received this note:     Your patient whom you referred for the Medical Nutrition Therapy Program has not come as you had requested.  Please discuss this with your patient.  When he/she decides to commit to the program, please let us know.   Called numerous times but patient did not call back    Thank you for this referral, and if we can be of further assistance to you or your patient, let us know.     [place]  Administrative Clinical Assistant  Direct Dial: [phone]  Fax: [phone]  Website: [email or web address]    Please contact the patient Part of our strategy for weight loss includes meeting with the nutritionist. Please let her know we'll want her to contact Otila Kluver (above) and schedule an appointment soon. Thank you

## 2017-10-18 NOTE — Telephone Encounter (Signed)
Patient called.  Patient aware.  

## 2017-10-20 ENCOUNTER — Other Ambulatory Visit: Payer: Self-pay | Admitting: Family Medicine

## 2017-10-20 NOTE — Telephone Encounter (Signed)
Request received for meloxicam I just approved  6 months last month Denied

## 2017-11-28 ENCOUNTER — Ambulatory Visit: Payer: PRIVATE HEALTH INSURANCE | Admitting: Family Medicine

## 2017-12-20 ENCOUNTER — Other Ambulatory Visit: Payer: Self-pay | Admitting: Family Medicine

## 2018-04-07 ENCOUNTER — Telehealth: Payer: Self-pay | Admitting: Family Medicine

## 2018-04-07 ENCOUNTER — Ambulatory Visit (INDEPENDENT_AMBULATORY_CARE_PROVIDER_SITE_OTHER): Payer: PRIVATE HEALTH INSURANCE | Admitting: Nurse Practitioner

## 2018-04-07 ENCOUNTER — Encounter: Payer: Self-pay | Admitting: Nurse Practitioner

## 2018-04-07 VITALS — BP 126/94 | HR 84 | Temp 98.4°F | Resp 18 | Ht 64.0 in | Wt 320.0 lb

## 2018-04-07 DIAGNOSIS — F418 Other specified anxiety disorders: Secondary | ICD-10-CM

## 2018-04-07 DIAGNOSIS — R5383 Other fatigue: Secondary | ICD-10-CM

## 2018-04-07 DIAGNOSIS — R0981 Nasal congestion: Secondary | ICD-10-CM | POA: Diagnosis not present

## 2018-04-07 DIAGNOSIS — R52 Pain, unspecified: Secondary | ICD-10-CM

## 2018-04-07 DIAGNOSIS — J029 Acute pharyngitis, unspecified: Secondary | ICD-10-CM | POA: Diagnosis not present

## 2018-04-07 DIAGNOSIS — J3489 Other specified disorders of nose and nasal sinuses: Secondary | ICD-10-CM

## 2018-04-07 LAB — POCT INFLUENZA A/B
Influenza A, POC: NEGATIVE
Influenza B, POC: NEGATIVE

## 2018-04-07 MED ORDER — FLUTICASONE PROPIONATE 50 MCG/ACT NA SUSP: 2.0000 | Freq: Every day | NASAL | 6 refills | Status: DC

## 2018-04-07 MED ORDER — AMOXICILLIN-POT CLAVULANATE 875-125 MG PO TABS: 1.0000 | ORAL_TABLET | Freq: Two times a day (BID) | ORAL | 0 refills | Status: DC

## 2018-04-07 MED ORDER — LORAZEPAM 0.5 MG PO TABS: ORAL_TABLET | ORAL | 0 refills | Status: DC

## 2018-04-07 NOTE — Telephone Encounter (Signed)
Copied from [place] [phone]. Topic: Quick Communication - Rx Refill/Question >> Apr 07, 2018 10:51 AM Reyne Dumas L wrote: Medication: amoxicillin-clavulanate (AUGMENTIN) 875-125 MG tablet  Pt states she is leaving on a flight on Wednesday and needs to be able to get this medication now. Pt would like the date restriction taken off of the RX. Pt can be reached at [phone]  Preferred Pharmacy (with phone number or street name): [id number] - [address], [place] [phone] (Phone) [phone] (Fax)  Agent: Please be advised that RX refills may take up to 3 business days. We ask that you follow-up with your pharmacy.

## 2018-04-07 NOTE — Progress Notes (Signed)
Name: Jasmine Tucker   MRN: [id number]    DOB: 1970-10-10   Date:04/07/2018       Progress Note  Subjective  Chief Complaint  Chief Complaint  Patient presents with  . Sinusitis    patient stated that she has been sick since Friday.  . Nasal Congestion  . Fatigue  . Generalized Body Aches  . Sore Throat  . Medication Refill    patient will be taking a flight soon and need a refill of her Ativan    HPI  Patient endorses rhinorrhea, watery eyes, facial pressure, strong headache behind eyes and cheeks that has been constant for the past 2 days. States started last Thursday. Taking OTC congestion MAX  medication and guaifenesin every 4 hours as directed without relief of symptoms. She developed sore throat yesterday. States feels fatigued and drained and having lower back, leg muscle aching. States at her job others have had the flu.   Has severe aviophobia, flying to go to cruise this Wednesday. Taken ativan for flights previously, prescribed by PCP here, only getting it from this clinic.   Patient Active Problem List   Diagnosis Date Noted  . Abnormal MRI, breast 04/09/2017  . Plantar fasciitis, bilateral 04/09/2017  . Anxiety 04/09/2017  . Mastalgia 09/27/2016  . Nipple discharge 09/27/2016  . Elevated red blood cell count 08/13/2016  . Microcytosis 11/04/2015  . ANA positive 11/04/2015  . Pain in both feet 11/03/2015  . Myalgia 11/03/2015  . Arthralgia 11/03/2015  . Vitamin D deficiency 11/03/2015  . Bariatric surgery status 11/03/2015  . Heel spur 11/03/2015  . Hidradenitis suppurativa 03/30/2015  . Morbid obesity ([place]) 03/30/2015  . Herpes simplex 03/30/2015  . Chronic anxiety 10/18/2014  . Fibroids 07/20/2014  . Genetic testing 07/13/2014  . Family history of ovarian cancer   . Family history of breast cancer     Past Medical History:  Diagnosis Date  . Anemia   . Anxiety   . Family history of breast cancer   . Family history of ovarian cancer   . GERD  (gastroesophageal reflux disease)   . Menorrhagia   . Plantar fasciitis 2017  . Thrombocytosis ([place])     Past Surgical History:  Procedure Laterality Date  . ABDOMINAL HYSTERECTOMY    . BREAST SURGERY     breast biopsy 4 months ago, left breast  . CHOLECYSTECTOMY    . GASTRIC BYPASS  2000  . LAPAROSCOPIC BILATERAL SALPINGECTOMY Bilateral 07/20/2014   Procedure: LAPAROSCOPIC BILATERAL SALPINGECTOMY;  Surgeon: Gae Dry, MD;  Location: [facility] OR[facility];  Service: Gynecology;  Laterality: Bilateral;  . LAPAROSCOPIC HYSTERECTOMY N/A 07/20/2014   Procedure: HYSTERECTOMY TOTAL LAPAROSCOPIC;  Surgeon: Gae Dry, MD;  Location: [facility] OR[facility];  Service: Gynecology;  Laterality: N/A;  . LAPAROSCOPIC OOPHERECTOMY Bilateral 07/20/2014   Procedure: LAPAROSCOPIC OOPHERECTOMY-(POSSIBLE);  Surgeon: Gae Dry, MD;  Location: [facility] OR[facility];  Service: Gynecology;  Laterality: Bilateral;    Social History   Tobacco Use  . Smoking status: Never Smoker  . Smokeless tobacco: Never Used  Substance Use Topics  . Alcohol use: Yes    Comment: socially     Current Outpatient Medications:  .  acetaminophen (TYLENOL) 500 MG tablet, Take 1 tablet (500 mg total) by mouth every 6 (six) hours as needed., Disp: , Rfl:  .  busPIRone (BUSPAR) 15 MG tablet, Take 0.5-1 tablets (7.5-15 mg total) by mouth 2 (two) times daily as needed., Disp: 30 tablet, Rfl: 1 .  diclofenac sodium (VOLTAREN)  1 % GEL, Apply small amount to affected area up to four times a day, Disp: 100 g, Rfl: 2 .  Insulin Pen Needle 31G X 8 MM MISC, For use with Saxenda, daily injections, Disp: 30 each, Rfl: 2 .  Liraglutide -Weight Management (SAXENDA) 18 MG/3ML SOPN, Inject 0.6 mg into the skin daily. x 1 week, then 1.2 mg daily x 1 week, then 1.8 mg daily x 1 week, then 2.4 mg daily x 1 week, then 3 mg daily, Disp: 3 mL, Rfl: 0 .  LORazepam (ATIVAN) 0.5 MG tablet, One pill by mouth 60-90 minutes before procedure; may take 1 more pill 30-60 minutes  after first pill; no alcohol with this, Disp: 7 tablet, Rfl: 0 .  meloxicam (MOBIC) 15 MG tablet, Take 1 tablet (15 mg total) by mouth daily as needed for pain., Disp: 30 tablet, Rfl: 5  Allergies  Allergen Reactions  . Latex Itching and Rash    ROS   No other specific complaints in a complete review of systems (except as listed in HPI above).  Objective  Vitals:   04/07/18 0941  BP: (!) 126/94  Pulse: 84  Resp: 18  Temp: 98.4 F (36.9 C)  TempSrc: Oral  SpO2: 95%  Weight: (!) 320 lb (145.2 kg)  Height: 5\' 4"  (1.626 m)    Body mass index is 54.93 kg/m.  Nursing Note and Vital Signs reviewed.  Physical Exam HENT:     Head: Normocephalic and atraumatic.     Right Ear: Hearing, ear canal and external ear normal. A middle ear effusion is present.     Left Ear: Hearing, ear canal and external ear normal. A middle ear effusion is present.     Nose:     Right Sinus: Frontal sinus tenderness present. No maxillary sinus tenderness.     Left Sinus: Frontal sinus tenderness present. No maxillary sinus tenderness.     Mouth/Throat:     Mouth: Mucous membranes are moist.     Pharynx: Uvula midline. Posterior oropharyngeal erythema present. No oropharyngeal exudate.  Eyes:     General:        Right eye: No discharge.        Left eye: No discharge.     Conjunctiva/sclera: Conjunctivae normal.  Neck:     Musculoskeletal: Normal range of motion.  Cardiovascular:     Rate and Rhythm: Normal rate.  Pulmonary:     Effort: Pulmonary effort is normal.     Breath sounds: Normal breath sounds.  Lymphadenopathy:     Cervical: No cervical adenopathy.  Skin:    General: Skin is warm and dry.     Findings: No rash.  Neurological:     Mental Status: She is alert.  Psychiatric:        Judgment: Judgment normal.      No results found for this or any previous visit (from the past 48 hour(s)).  Assessment & Plan  1. Nasal congestion - POCT Influenza A/B - fluticasone  (FLONASE) 50 MCG/ACT nasal spray; Place 2 sprays into both nostrils daily.  Dispense: 16 g; Refill: 6  2. Generalized body aches - POCT Influenza A/B  3. Other fatigue - POCT Influenza A/B  4. Sore throat  - POCT Influenza A/B  5. Sinus pressure Discussed delayed abx, if symptoms worsen can start abx therapies - amoxicillin-clavulanate (AUGMENTIN) 875-125 MG tablet; Take 1 tablet by mouth 2 (two) times daily.  Dispense: 20 tablet; Refill: 0  6. Situational anxiety PMP  aware checked, no suspicious activity.  - LORazepam (ATIVAN) 0.5 MG tablet; One pill by mouth 60-90 minutes before procedure; may take 1 more pill 30-60 minutes after first pill; no alcohol with this  Dispense: 2 tablet; Refill: 0  Patient presents with sinusitis and possible flulike symptoms.  Discussed stop taking the over-the-counter sinus max medication she is taking as it has pseudoephedrine and her diastolic is elevated.  Gave her OTC recommendations for management.  She does have eustachian tube dysfunction recommend Flonase and antihistamine.  Discussed likely viral etiology, however she will be on ship will send in antibiotics just in case she needs it for secondary bacterial infections.  Discussed indications with patient who is agreeable to plan.  Sent 2 pills of Ativan to help her for her flight there and back, PMP aware checked with no suspicious activity noted. -Red flags and when to present for emergency care or RTC including fever >101.38F, chest pain, shortness of breath, new/worsening/un-resolving symptoms, reviewed with patient at time of visit. Follow up and care instructions discussed and provided in AVS.

## 2018-04-07 NOTE — Patient Instructions (Addendum)
-  Take either bendaryl 25-50mg  a night for the next few night OR a non-drowsy antihistamine such as Claritin, Allegra, Zyrtec, Xyzal once daily.  -Flonase in each nares daily - Continue guaifenesin  - If you're symptom do not improve in the next 3 days or significantly worsen at any time please start taking the antibiotic and read the instructions below:Please take your antibiotic as prescribed with food on your stomach to prevent nausea and upset stomach. Take the antibiotic for the entire course, even if your symptoms resolve before the course if completed. Using antibiotics inappropriately can make it harder to treat future infections. If you have any concerning side effects please stop the medication and let us know immediately. I do recommend taking a probiotic to replenish your good gut health anytime you take an antibiotic. You can get probiotics in types of yogurt like activia, or other food/drinks such as kimchi, kombucha , sauerkraut, or you can take an over the counter supplement.    Please rest, drink plenty of water, and eat nutritious foods. Cover your nose/mouth when you cough or sneeze and wash your hands well and often. Here are some helpful things you can use or pick up over the counter from the pharmacy to help with your symptoms:   For Fever/Pain: Acetaminophen every 6 hours as needed (maximum of 3000mg  a day). If you are still uncomfortable you can add ibuprofen OR naproxen  For coughing: try dextromethorphan for a cough suppressant, and/or a cool mist humidifier, lozenges  For sore throat: saline gargles, honey herbal tea, lozenges, throat spray  To dry out your nose: try an antihistamine like loratadine (non-sedating) or diphenhydramine (sedating) or others To relieve a stuffy nose: try a neti pot To make blowing your nose easier and relieve chest congestion: guaifenesin 400mg  every 4-6 hours of guaifenesin ER [phone] mg every 12 hours. Do not take more than 2,400mg  a day.

## 2018-04-07 NOTE — Telephone Encounter (Signed)
Pt was seen in office today by Benjamine Mola, NP and medication was filled.

## 2018-04-08 ENCOUNTER — Other Ambulatory Visit: Payer: Self-pay | Admitting: Nurse Practitioner

## 2018-04-08 DIAGNOSIS — J3489 Other specified disorders of nose and nasal sinuses: Secondary | ICD-10-CM

## 2018-04-08 MED ORDER — AMOXICILLIN-POT CLAVULANATE 875-125 MG PO TABS: 1.0000 | ORAL_TABLET | Freq: Two times a day (BID) | ORAL | 0 refills | Status: DC

## 2018-04-08 NOTE — Telephone Encounter (Signed)
Sent rx she can fill now.

## 2018-04-08 NOTE — Telephone Encounter (Signed)
Pt called again to follow up on rx for amoxicillin-clavulanate (AUGMENTIN) 875-125 MG tablet. She is requesting that NP Benjamine Mola send a new rx without the date restriction of 04/09/18 because she is leaving on a 7am flight on 04/09/18 and she would like to be able to have the medication with her as she is going on a cruise and would like to have it in the event that her symptoms worsen. Please reach out to pt. CB# [phone]  [place], [facility] [address]       [phone] (Phone) [phone] (Fax)

## 2018-04-28 ENCOUNTER — Telehealth: Payer: Self-pay | Admitting: Family Medicine

## 2018-04-28 NOTE — Telephone Encounter (Signed)
Copied from [place] [id number]. Topic: Quick Communication - Rx Refill/Question >> Apr 28, 2018 12:03 PM Selinda Flavin B, NT wrote: Medication: meloxicam (MOBIC) 15 MG tablet   Has the patient contacted their pharmacy? Yes.   (Agent: If no, request that the patient contact the pharmacy for the refill.) (Agent: If yes, when and what did the pharmacy advise?)  Preferred Pharmacy (with phone number or street name): [facility] [place], [place]: Please be advised that RX refills may take up to 3 business days. We ask that you follow-up with your pharmacy.

## 2018-04-29 NOTE — Telephone Encounter (Signed)
Last diastolic pressure over 90 Rx for oral NSAID refused Have her schedule an appointment with me

## 2018-04-30 ENCOUNTER — Telehealth: Payer: Self-pay

## 2018-04-30 NOTE — Telephone Encounter (Signed)
The medicine she is requesting can raise blood pressure Her blood pressure is high High blood pressure can cause a stroke I don't want her to take meloxicam and I don't want her to have a stroke

## 2018-04-30 NOTE — Telephone Encounter (Signed)
Mailbox full. Pt need to schedule appt

## 2018-04-30 NOTE — Telephone Encounter (Signed)
Patient called is upset meloxicam was denied. Read note from you.  States she was just here and if you look back in records blood pressure has never been high.  She states is tired of going here and there for referrals and racking up medical bills.  She was just here and does not understand why she is needs to be seen again.  Please advise?

## 2018-04-30 NOTE — Telephone Encounter (Signed)
Informed patient of the previous message in regards to scheduling an appointment with Dr. Sanda Klein.  Patient insisted on speaking with Dr. Sanda Klein because the times that provider has available patient cannot do because of work.  I also informed patient that Dr. Sanda Klein would not consult about this issue over the phone especially since per Dr. Sanda Klein patient's blood pressure is high and a face to face visit would be needed.  Patient stated that she doesn't have high blood pressure.  Patient still insisted on speaking with Dr. Sanda Klein via phone.  I informed patient that I would send this message back.

## 2018-05-01 NOTE — Addendum Note (Signed)
Addended by: LADA, Satira Anis on: 05/01/2018 07:02 PM   Modules accepted: Orders

## 2018-05-01 NOTE — Telephone Encounter (Signed)
Someone please reach out to her. Let her know that I've tried to reach her but her mailbox is full. We really need to see her in the office. I'm not trying to be punitive. I'm looking out for her because I care about her. We need to make sure that her blood pressure is down before continuing the NSAID. That's all. Her BP was high. NSAIDs can raise BP. High BP can lead to a stroke. I'm trying to keep her from having a stroke.

## 2018-05-01 NOTE — Telephone Encounter (Signed)
I tried to call this patient but the mailbox was full I could not speak with her, and I could not leave a message

## 2018-05-01 NOTE — Telephone Encounter (Signed)
Pt calling to speak with Miel. She states that she is upset that Dr. Sanda Klein is not getting back to her and that she can't get an appt with her in a timely manner. Pt states that at her appt last week she should have been informed her medication would be denied. Please advise.

## 2018-05-09 ENCOUNTER — Ambulatory Visit (INDEPENDENT_AMBULATORY_CARE_PROVIDER_SITE_OTHER): Payer: PRIVATE HEALTH INSURANCE | Admitting: Family Medicine

## 2018-05-09 ENCOUNTER — Encounter: Payer: Self-pay | Admitting: Family Medicine

## 2018-05-09 VITALS — BP 115/71 | HR 69 | Temp 97.9°F | Ht 64.6 in | Wt 315.0 lb

## 2018-05-09 DIAGNOSIS — Z791 Long term (current) use of non-steroidal anti-inflammatories (NSAID): Secondary | ICD-10-CM

## 2018-05-09 DIAGNOSIS — M722 Plantar fascial fibromatosis: Secondary | ICD-10-CM

## 2018-05-09 DIAGNOSIS — F419 Anxiety disorder, unspecified: Secondary | ICD-10-CM

## 2018-05-09 DIAGNOSIS — Z7689 Persons encountering health services in other specified circumstances: Secondary | ICD-10-CM

## 2018-05-09 MED ORDER — MELOXICAM 15 MG PO TABS: 15.0000 mg | ORAL_TABLET | Freq: Every day | ORAL | 3 refills | Status: DC | PRN

## 2018-05-09 MED ORDER — OMEPRAZOLE 40 MG PO CPDR: 40.0000 mg | DELAYED_RELEASE_CAPSULE | Freq: Every day | ORAL | 3 refills | Status: DC

## 2018-05-09 NOTE — Progress Notes (Signed)
BP 115/71   Pulse 69   Temp 97.9 F (36.6 C) (Oral)   Ht 5' 4.6" (1.641 m)   Wt (!) 315 lb (142.9 kg)   LMP  (LMP Unknown)   SpO2 98%   BMI 53.07 kg/m    Subjective:    Patient ID: Jasmine Tucker, female    DOB: 06/09/1970, 48 y.o.   MRN: [id number]  HPI: Jasmine Tucker is a 48 y.o. female  Chief Complaint  Patient presents with  . Establish Care    pt states she is requesting a refill on meloxicam   Here today to establish care.   Was previously getting cortisone injections for b/l plantar fasciitis, now on prn meloxicam which seems to help some. Wears supportive shoes and tries to stretch calves. Needs refill on meloxicam.   Taking buspar as needed, doing well right now with her anxiety. Taking lorazepam if flying or going to dentist only. Denies mood issues, SI/HI.   Relevant past medical, surgical, family and social history reviewed and updated as indicated. Interim medical history since our last visit reviewed. Allergies and medications reviewed and updated.  Review of Systems  Per HPI unless specifically indicated above     Objective:    BP 115/71   Pulse 69   Temp 97.9 F (36.6 C) (Oral)   Ht 5' 4.6" (1.641 m)   Wt (!) 315 lb (142.9 kg)   LMP  (LMP Unknown)   SpO2 98%   BMI 53.07 kg/m   Wt Readings from Last 3 Encounters:  05/09/18 (!) 315 lb (142.9 kg)  04/07/18 (!) 320 lb (145.2 kg)  09/26/17 (!) 307 lb 3.2 oz (139.3 kg)    Physical Exam Vitals signs and nursing note reviewed.  Constitutional:      Appearance: Normal appearance. She is not ill-appearing.  HENT:     Head: Atraumatic.  Eyes:     Extraocular Movements: Extraocular movements intact.     Conjunctiva/sclera: Conjunctivae normal.  Neck:     Musculoskeletal: Normal range of motion and neck supple.  Cardiovascular:     Rate and Rhythm: Normal rate and regular rhythm.     Heart sounds: Normal heart sounds.  Pulmonary:     Effort: Pulmonary effort is normal.     Breath  sounds: Normal breath sounds.  Musculoskeletal: Normal range of motion.  Skin:    General: Skin is warm and dry.  Neurological:     Mental Status: She is alert and oriented to person, place, and time.  Psychiatric:        Mood and Affect: Mood normal.        Thought Content: Thought content normal.        Judgment: Judgment normal.     Results for orders placed or performed in visit on 62/70/35  Basic Metabolic Panel (BMET)  Result Value Ref Range   Glucose 77 65 - 99 mg/dL   BUN 17 6 - 24 mg/dL   Creatinine, Ser 0.53 (L) 0.57 - 1.00 mg/dL   GFR calc non Af Amer 112 >59 mL/min/1.73   GFR calc Af Amer 129 >59 mL/min/1.73   BUN/Creatinine Ratio 32 (H) 9 - 23   Sodium 142 134 - 144 mmol/L   Potassium 4.3 3.5 - 5.2 mmol/L   Chloride 103 96 - 106 mmol/L   CO2 23 20 - 29 mmol/L   Calcium 9.3 8.7 - 10.2 mg/dL      Assessment & Plan:   Problem List  Items Addressed This Visit      Musculoskeletal and Integument   Plantar fasciitis, bilateral    Continue prn meloxicam and good supportive care        Other   Chronic anxiety    Stable on prn buspar. Continue current regimen       Other Visit Diagnoses    NSAID long-term use    -  Primary   Will check bmp and refill meloxicam if normal   Relevant Orders   Basic Metabolic Panel (BMET) (Completed)   Encounter to establish care           Follow up plan: Return in about 6 months (around 11/09/2018) for CPE.

## 2018-05-10 LAB — BASIC METABOLIC PANEL
BUN/Creatinine Ratio: 32 — ABNORMAL HIGH (ref 9–23)
BUN: 17 mg/dL (ref 6–24)
CO2: 23 mmol/L (ref 20–29)
Calcium: 9.3 mg/dL (ref 8.7–10.2)
Chloride: 103 mmol/L (ref 96–106)
Creatinine, Ser: 0.53 mg/dL — ABNORMAL LOW (ref 0.57–1.00)
GFR calc Af Amer: 129 mL/min/{1.73_m2} (ref >59)
GFR calc non Af Amer: 112 mL/min/{1.73_m2} (ref >59)
Glucose: 77 mg/dL (ref 65–99)
Potassium: 4.3 mmol/L (ref 3.5–5.2)
Sodium: 142 mmol/L (ref 134–144)

## 2018-05-11 NOTE — Assessment & Plan Note (Signed)
Continue prn meloxicam and good supportive care

## 2018-05-11 NOTE — Assessment & Plan Note (Signed)
Stable on prn buspar. Continue current regimen

## 2018-05-12 ENCOUNTER — Encounter: Payer: Self-pay | Admitting: Family Medicine

## 2018-07-26 ENCOUNTER — Encounter: Payer: Self-pay | Admitting: Emergency Medicine

## 2018-07-26 ENCOUNTER — Emergency Department
Admission: EM | Admit: 2018-07-26 | Discharge: 2018-07-26 | Disposition: A | Discharge status: Home / Self Care | Payer: Managed Care, Other (non HMO) | Attending: Emergency Medicine | Admitting: Emergency Medicine

## 2018-07-26 ENCOUNTER — Other Ambulatory Visit: Payer: Self-pay

## 2018-07-26 DIAGNOSIS — R079 Chest pain, unspecified: Secondary | ICD-10-CM | POA: Diagnosis present

## 2018-07-26 DIAGNOSIS — Z5321 Procedure and treatment not carried out due to patient leaving prior to being seen by health care provider: Secondary | ICD-10-CM | POA: Diagnosis not present

## 2018-07-26 LAB — BASIC METABOLIC PANEL
Anion gap: 11 (ref 5–15)
BUN: 17 mg/dL (ref 6–20)
CO2: 24 mmol/L (ref 22–32)
Calcium: 9.4 mg/dL (ref 8.9–10.3)
Chloride: 105 mmol/L (ref 98–111)
Creatinine, Ser: 0.52 mg/dL (ref 0.44–1.00)
GFR calc Af Amer: >60 mL/min (ref >60)
GFR calc non Af Amer: >60 mL/min (ref >60)
Glucose, Bld: 97 mg/dL (ref 70–99)
Potassium: 3.7 mmol/L (ref 3.5–5.1)
Sodium: 140 mmol/L (ref 135–145)

## 2018-07-26 LAB — CBC
HCT: 46.2 % — ABNORMAL HIGH (ref 36.0–46.0)
Hemoglobin: 14.9 g/dL (ref 12.0–15.0)
MCH: 28.3 pg (ref 26.0–34.0)
MCHC: 32.3 g/dL (ref 30.0–36.0)
MCV: 87.8 fL (ref 80.0–100.0)
Platelets: 248 10*3/uL (ref 150–400)
RBC: 5.26 MIL/uL — ABNORMAL HIGH (ref 3.87–5.11)
RDW: 14 % (ref 11.5–15.5)
WBC: 6.1 10*3/uL (ref 4.0–10.5)
nRBC: 0 % (ref 0.0–0.2)

## 2018-07-26 LAB — TROPONIN I: Troponin I: <0.03 ng/mL (ref <0.03)

## 2018-07-26 MED ORDER — SODIUM CHLORIDE 0.9% FLUSH: 3.0000 mL | Freq: Once | INTRAVENOUS | Status: DC

## 2018-07-26 NOTE — ED Notes (Signed)
Pt asked how long it would take to be seen. Pt advised that we have no way to give wait times. Pt stated she did not want to wait in the lobby. Pt told that in order to get results of blood work she needs to be seen by EDP. Pt states she feels better and is going to leave.

## 2018-07-26 NOTE — ED Triage Notes (Signed)
Pt to ED with c/o of chest pain that started approx 1 hour ago. Pt states central chest pain that radiated to her left arm and jaw. Pt states pain has started to ease.

## 2018-07-30 ENCOUNTER — Telehealth: Payer: Self-pay | Admitting: Emergency Medicine

## 2018-07-31 ENCOUNTER — Telehealth: Payer: Self-pay | Admitting: Family Medicine

## 2018-07-31 NOTE — Telephone Encounter (Signed)
Will discuss this during upcoming OV  Copied from [place] [id number]. Topic: General - Other >> Jul 30, 2018  4:57 PM Reyne Dumas L wrote: Reason for CRM:   Pt called wanting to speak with PCP about her ER visit on Sunday.  Pt states that she left after EKG was done because she didn't want to sit in waiting room with COVID patients.   Pt states she is no longer having symptoms and wants to know what she needs to do going forward.  Pt also wants PCP to give her all results of things done in hospital. Pt can be reached at [phone] >> Jul 30, 2018  5:04 PM Percell Belt A wrote: Pt called back to get make an appt for fu  Best number  [phone] >> Jul 31, 2018  8:39 AM Jill Side wrote: Scheduled virtual appt for tomorrow at 2

## 2018-08-01 ENCOUNTER — Encounter: Payer: Self-pay | Admitting: Family Medicine

## 2018-08-01 ENCOUNTER — Ambulatory Visit (INDEPENDENT_AMBULATORY_CARE_PROVIDER_SITE_OTHER): Payer: PRIVATE HEALTH INSURANCE | Admitting: Family Medicine

## 2018-08-01 ENCOUNTER — Other Ambulatory Visit: Payer: Self-pay

## 2018-08-01 VITALS — Wt 300.0 lb

## 2018-08-01 DIAGNOSIS — R079 Chest pain, unspecified: Secondary | ICD-10-CM | POA: Diagnosis not present

## 2018-08-01 DIAGNOSIS — F419 Anxiety disorder, unspecified: Secondary | ICD-10-CM | POA: Diagnosis not present

## 2018-08-01 DIAGNOSIS — J309 Allergic rhinitis, unspecified: Secondary | ICD-10-CM | POA: Insufficient documentation

## 2018-08-01 DIAGNOSIS — J301 Allergic rhinitis due to pollen: Secondary | ICD-10-CM

## 2018-08-01 NOTE — Assessment & Plan Note (Signed)
Continue buspar prn, may consider starting to take on regular basis if anxiety continues to be heightened. Suspect her episode of CP was mostly related to her anxiety being exacerbated and not taking her medication

## 2018-08-01 NOTE — Assessment & Plan Note (Signed)
Continue claritin, start flonase BID. F/u if sxs not improving
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
## 2018-10-16 IMAGING — US ULTRASOUND LEFT BREAST LIMITED
1 series · 15 of 16 positions shown · IV contrast (agent unspecified)
Comparison: None.

ADDENDUM:
The patient presented on 10/17/2016 for ultrasound-guided biopsy of
the area of shadowing seen at 2 o'clock, 10 cm from the nipple
within the left breast. Targeted ultrasound of the upper, outer left
breast was performed; however, the area of shadowing was unable to
be definitively identified to allow for biopsy.

Tomosynthesis guided biopsy of the asymmetry described in the upper,
outer left breast on recent diagnostic exam was then considered. On
repeat full paddle CC view of the left breast, the possible
asymmetry seen on recent diagnostic mammogram persisted but was less
prominent. A CC spot compression view of the lateral breast was also
performed as it was unclear whether the asymmetry had been included
in the initial CC spot compression view. On today's spot compression
view, the asymmetry seen mammographically in the lateral left breast
completely disperses and is favored to represent normal breast
tissue. It was felt that this asymmetry was unlikely to be seen to
allow for tomosynthesis guided biopsy.
A bilateral breast MRI with contrast is recommended for further
evaluation of the patient's left nipple discharge and for high risk
screening as the patient has a significant family history of breast
cancer in an aunt and two Joemaries and a family history of ovarian
cancer in her mother. If the breast MRI is negative, a six-month
follow-up left diagnostic mammogram would be recommended for the
asymmetry described above. In addition, a surgical consultation
would be recommended for the patient's left nipple discharge.
CLINICAL DATA: 46-year-old female presenting for baseline exam and
evaluation of spontaneous left nipple discharge 3 months ago. She
has not had any discharge since this time, however the technologist
indicated that she did have significant discharge on compression
with the exam today.The patient does have family history of breast
cancer in a maternal aunt and 2 Joemaries. She has family history of
ovarian cancer in her mother.
EXAM:
2D DIGITAL DIAGNOSTIC BILATERAL MAMMOGRAM WITH CAD AND ADJUNCT TOMO
LEFT BREAST ULTRASOUND

[Series 1: ultrasound left breast limited · 0.08mm/px · 15 of 16 slices shown]
[im 1/16]
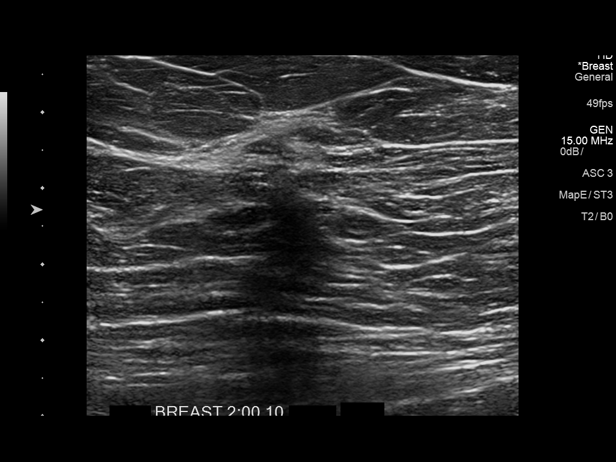
[im 2/16]
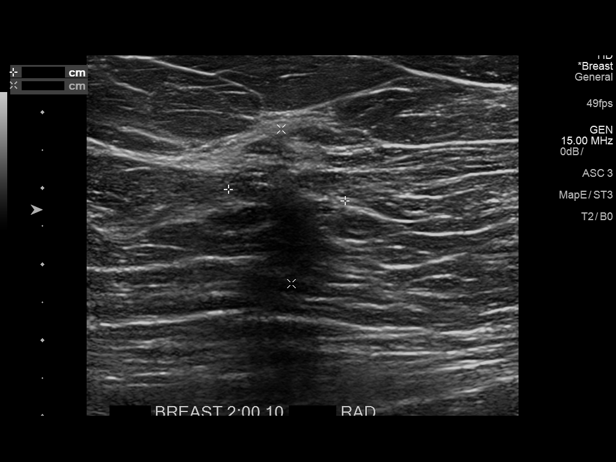
[im 3/16]
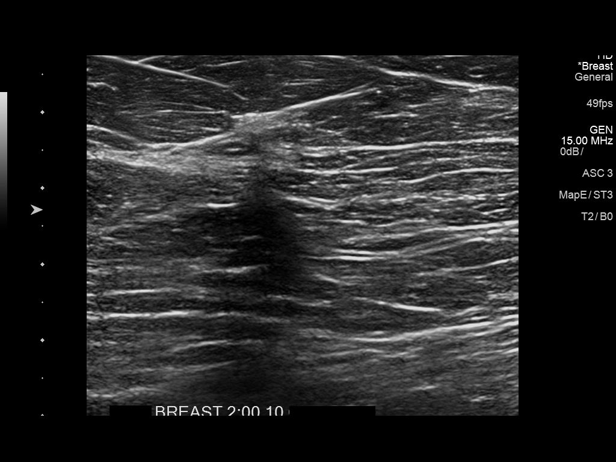
[im 4/16]
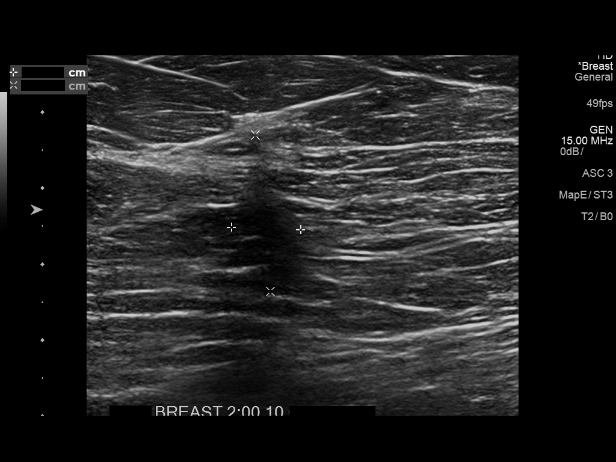
[im 5/16]
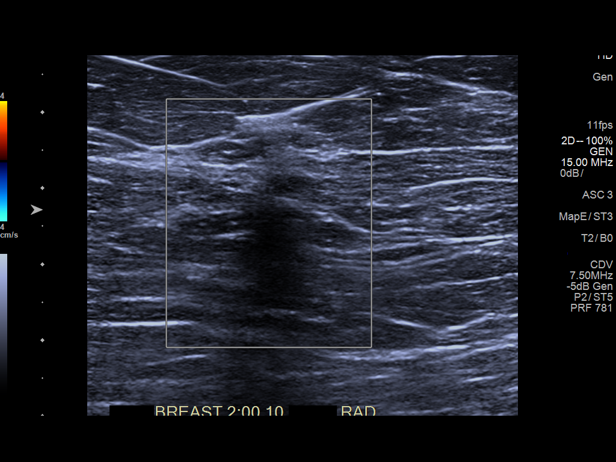
[im 6/16]
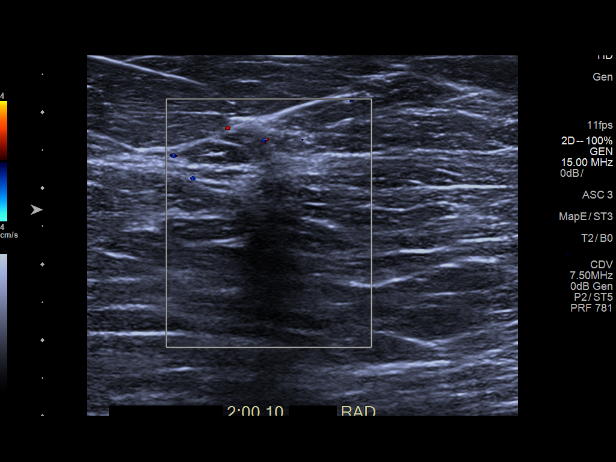
[im 7/16]
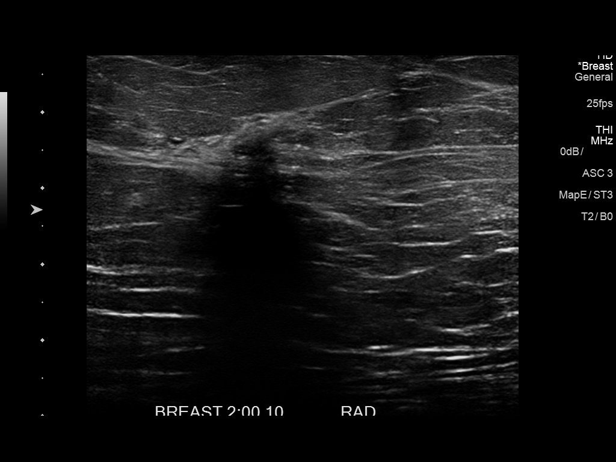
[im 9/16]
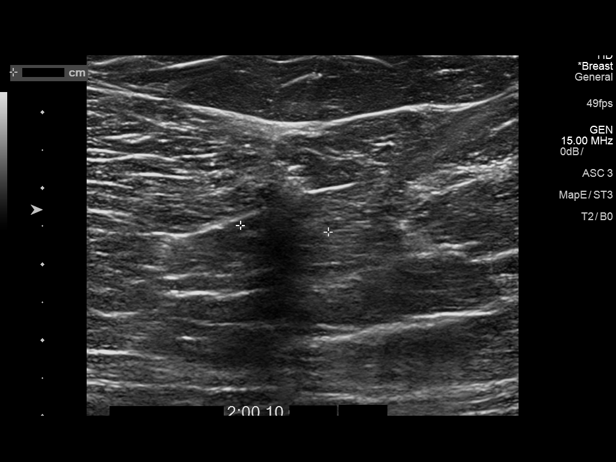
[im 10/16]
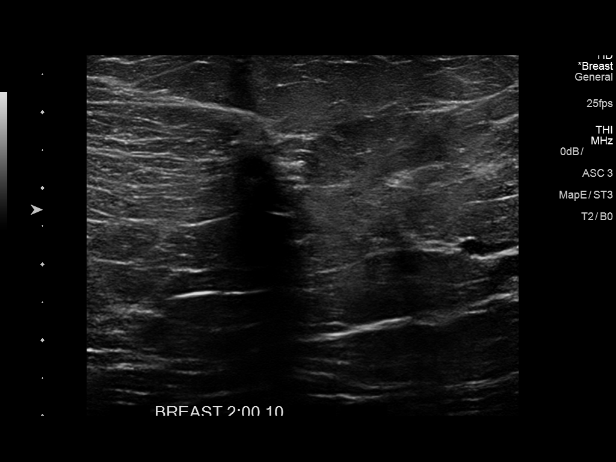
[im 11/16]
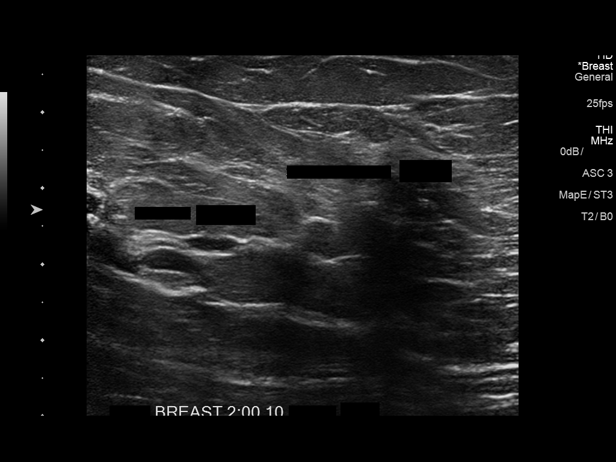
[im 12/16]
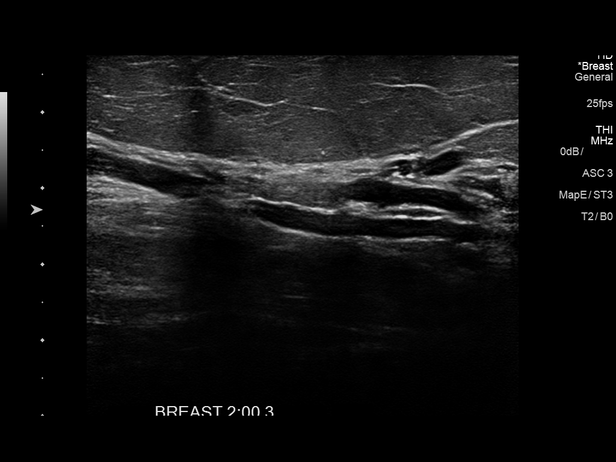
[im 13/16]
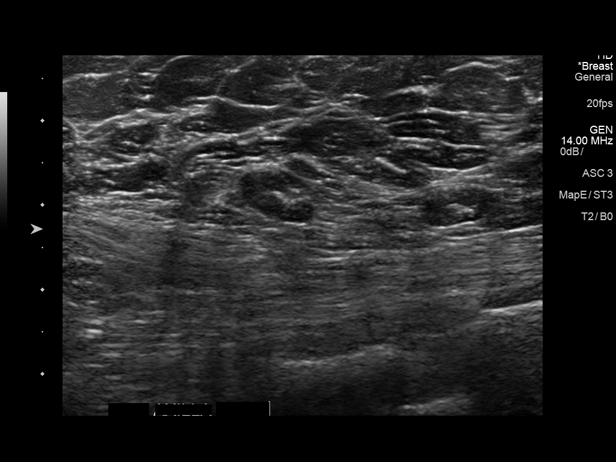
[im 14/16]
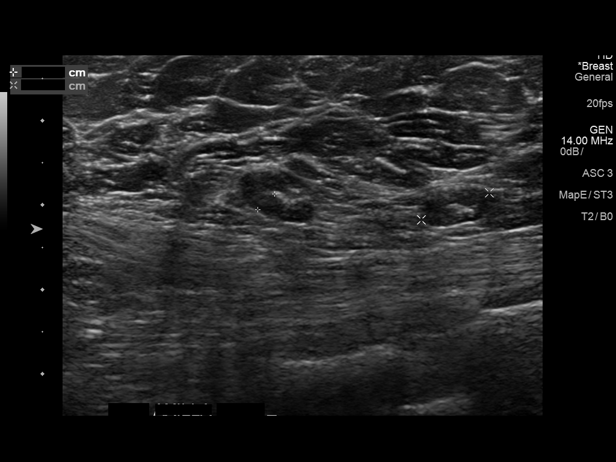
[im 15/16]
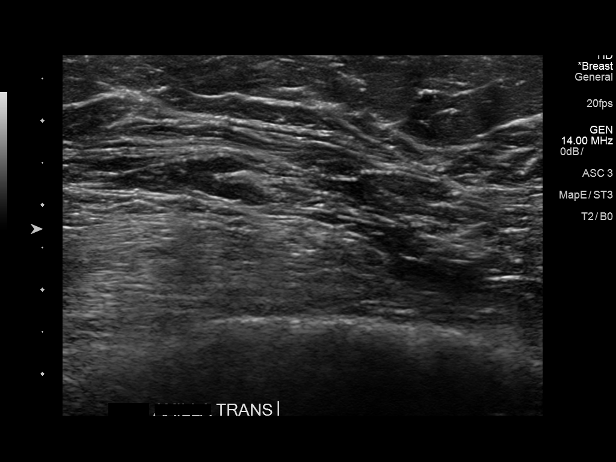
[im 16/16]
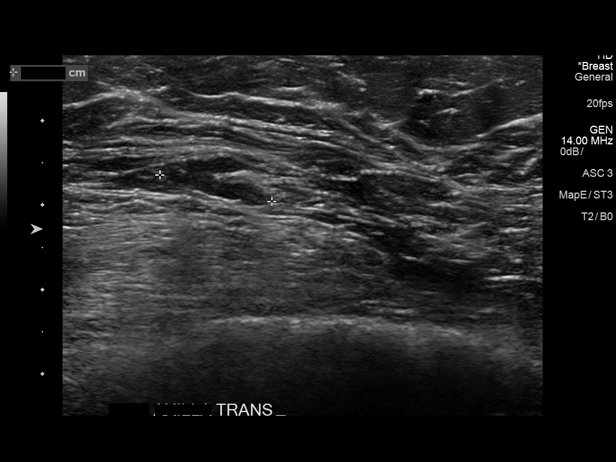

[15 of 16 positions shown; findings below may reference images not displayed]

ACR Breast Density Category b: There are scattered areas of
fibroglandular density.
FINDINGS: In the lateral aspect of the left breast there dilated ducts in a
segmental distribution spanning the middle and anterior depths.
Approximately 4 cm farther distally to the posterior aspect of the
dilated ducts there is a vague ill-defined focal asymmetry, however
spot compression tomosynthesis images do not demonstrate any
definite distortion. No other suspicious calcifications, masses or
areas of distortion are seen in the bilateral breasts. The
technologist did inform me that the patient had significant nipple
discharge from her left breast with compression.

Mammographic images were processed with CAD.

Physical exam the upper-outer quadrant of the left breast
demonstrates no definite palpable masses, however the patient does
endorse tenderness focally in the upper-outer left breast at 2
o'clock.

Ultrasound targeted to the left breast at 2 o'clock demonstrates
multiple dilated ducts extending from the nipple to the upper-outer
quadrant. At 2 o'clock, 10 cm from the nipple at the point of
maximal tenderness for the patient, there is a vague area of
shadowing without a distinct mass. This is best seen with harmonics
imaging. There is no significant blood flow within the area of
shadowing on color Doppler imaging. Ultrasound of the left axilla
demonstrates several normal-appearing lymph nodes.
IMPRESSION: 1. There is a suspicious area of shadowing in the upper-outer
quadrant of the left breast which is just distal to the posterior
edge of the dilated ducts. This is likely the source of her nipple
discharge.

2. There is a focal asymmetry in the upper-outer left breast. This
could, but does not definitely correspond with the area of shadowing
identified sonographically.

3.  No evidence of left axillary lymphadenopathy.

4. There is no mammographic evidence of malignancy in the right
breast.

RECOMMENDATION:
1. Ultrasound-guided biopsy is recommended for the area of shadowing
at 2 o'clock in the left breast.

2. Attention on the post biopsy mammogram is recommended to
determine if the clip resides at the area of the focal asymmetry
seen mammographically. If not, and the biopsy is positive for
malignancy, consider stereotactic biopsy, though MRI may be
performed first (see recommendation #3).

3. MRI is recommended following the biopsy. If the biopsy results
are benign, MRI is necessary to identify the source of the dilated
ducts and nipple discharge. If the biopsy results are positive, MRI
is recommended to determine the extent of disease given the
ill-defined appearance mammographically and sonographically.
Additionally, due to the family history of breast and ovarian
cancer, MRI will screen the contralateral breast prior to surgery if
necessary.

4. Given the family history of breast cancer and ovarian cancer,
recommended genetic counseling, if not already performed. Per
American Cancer Society guidelines, if the patient has a calculated
lifetime risk of developing breast cancer of greater than 20%,
annual screening MRI of the breasts would be recommended at the time
of screening mammography.

I have discussed the findings and recommendations with the patient.
Results were also provided in writing at the conclusion of the
visit. If applicable, a reminder letter will be sent to the patient
regarding the next appointment.

BI-RADS CATEGORY  4: Suspicious.

## 2019-06-03 ENCOUNTER — Telehealth: Payer: Self-pay | Admitting: Family Medicine

## 2019-06-03 NOTE — Telephone Encounter (Signed)
This came to the wrong office. Thanks 

## 2019-06-03 NOTE — Telephone Encounter (Signed)
RX REFILL LORazepam (ATIVAN) 0.5 MG tablet   Patient states she is not on medication and would like new prescription for medication if possible.  PHARMACY [facility] DR[facility] STORE [place], [place] AT [facility] OF [address] [facility] Phone:  [phone]  Fax:  [phone]

## 2019-07-13 ENCOUNTER — Other Ambulatory Visit: Payer: Self-pay | Admitting: Family Medicine

## 2019-07-13 ENCOUNTER — Telehealth: Payer: Self-pay | Admitting: Family Medicine

## 2019-07-13 DIAGNOSIS — F418 Other specified anxiety disorders: Secondary | ICD-10-CM

## 2019-07-13 MED ORDER — MELOXICAM 15 MG PO TABS: 15.0000 mg | ORAL_TABLET | Freq: Every day | ORAL | 0 refills | Status: DC | PRN

## 2019-07-13 NOTE — Telephone Encounter (Signed)
Requested medication (s) are due for refill today: yes  Requested medication (s) are on the active medication list: yes  Last refill:  04/07/18  Future visit scheduled: Yes   Notes to clinic:  Pt has upcoming OV to discuss stress and anxiety   Requested Prescriptions  Pending Prescriptions Disp Refills   LORazepam (ATIVAN) 0.5 MG tablet 2 tablet     Sig: One pill by mouth 60-90 minutes before procedure; may take 1 more pill 30-60 minutes after first pill; no alcohol with this      Not Delegated - Psychiatry:  Anxiolytics/Hypnotics Failed - 07/13/2019  2:37 PM      Failed - This refill cannot be delegated      Failed - Urine Drug Screen completed in last 360 days.      Failed - Valid encounter within last 6 months    Recent Outpatient Visits           11 months ago Chest pain, unspecified type   [facility] [place], [place]   1 year ago NSAID long-term use   [facility] [place], [place]   1 year ago Nasal congestion   [place], Bethel Born, NP   1 year ago Situational anxiety   [facility], Satira Anis, MD   2 years ago Abnormal MRI, breast   [place], [place], MD       Future Appointments             In 2 days [place] American, [place], [facility]             Signed Prescriptions Disp Refills   meloxicam (MOBIC) 15 MG tablet 90 tablet 0    Sig: Take 1 tablet (15 mg total) by mouth daily as needed for pain.      Analgesics:  COX2 Inhibitors Passed - 07/13/2019  2:37 PM      Passed - HGB in normal range and within 360 days    Hemoglobin  Date Value Ref Range Status  07/26/2018 14.9 12.0 - 15.0 g/dL Final  10/18/2014 11.4 11.1 - 15.9 g/dL Final          Passed - Cr in normal range and within 360 days    Creat  Date Value Ref Range Status  08/10/2016 0.48 (L) 0.50 - 1.10 mg/dL Final   Creatinine, Ser   Date Value Ref Range Status  07/26/2018 0.52 0.44 - 1.00 mg/dL Final          Passed - Patient is not pregnant      Passed - Valid encounter within last 12 months    Recent Outpatient Visits           11 months ago Chest pain, unspecified type   [facility] [place], [place]   1 year ago NSAID long-term use   [facility] Vol[place] American, [place]   1 year ago Nasal congestion   [place], NP   1 year ago Situational anxiety   [place], Satira Anis, MD   2 years ago Abnormal MRI, breast   [place], Satira Anis, MD       Future Appointments             In 2 days Orene Desanctis, Lilia Argue, [place], [place]

## 2019-07-13 NOTE — Telephone Encounter (Signed)
Pt made appt for Wednesday to discuss anxiety medication Requested Prescriptions  Pending Prescriptions Disp Refills  . meloxicam (MOBIC) 15 MG tablet 90 tablet 0    Sig: Take 1 tablet (15 mg total) by mouth daily as needed for pain.     Analgesics:  COX2 Inhibitors Passed - 07/13/2019  2:37 PM      Passed - HGB in normal range and within 360 days    Hemoglobin  Date Value Ref Range Status  07/26/2018 14.9 12.0 - 15.0 g/dL Final  10/18/2014 11.4 11.1 - 15.9 g/dL Final         Passed - Cr in normal range and within 360 days    Creat  Date Value Ref Range Status  08/10/2016 0.48 (L) 0.50 - 1.10 mg/dL Final   Creatinine, Ser  Date Value Ref Range Status  07/26/2018 0.52 0.44 - 1.00 mg/dL Final         Passed - Patient is not pregnant      Passed - Valid encounter within last 12 months    Recent Outpatient Visits          11 months ago Chest pain, unspecified type   [facility] Vol[place], [place]   1 year ago NSAID long-term use   [facility] [place], [place]   1 year ago Nasal congestion   [place], NP   1 year ago Situational anxiety   [place], Satira Anis, MD   2 years ago Abnormal MRI, breast   [place], Satira Anis, MD      Future Appointments            In 2 days Volney American, [place], [place]           . LORazepam (ATIVAN) 0.5 MG tablet 2 tablet     Sig: One pill by mouth 60-90 minutes before procedure; may take 1 more pill 30-60 minutes after first pill; no alcohol with this     Not Delegated - Psychiatry:  Anxiolytics/Hypnotics Failed - 07/13/2019  2:37 PM      Failed - This refill cannot be delegated      Failed - Urine Drug Screen completed in last 360 days.      Failed - Valid encounter within last 6 months    Recent Outpatient Visits          11 months ago Chest pain,  unspecified type   [facility] [place] American, [place]   1 year ago NSAID long-term use   [facility] Mer[place] Roof [place], [place]   1 year ago Nasal congestion   [place], NP   1 year ago Situational anxiety   [facility] [place], Satira Anis, MD   2 years ago Abnormal MRI, breast   [place], Satira Anis, MD      Future Appointments            In 2 days Volney American, [place], [place]

## 2019-07-13 NOTE — Telephone Encounter (Signed)
Routing to provider  

## 2019-07-13 NOTE — Telephone Encounter (Signed)
Medication: LORazepam (ATIVAN) 0.5 MG tablet [id number] , meloxicam (MOBIC) 15 MG tablet [id number]   Has the patient contacted their pharmacy? Yes  (Agent: If no, request that the patient contact the pharmacy for the refill.) (Agent: If yes, when and what did the pharmacy advise?)  Preferred Pharmacy (with phone number or street name): [facility]ORE [place], [place] - [place] AT [place]  Phone:  [phone] Fax:  [phone]     Agent: Please be advised that RX refills may take up to 3 business days. We ask that you follow-up with your pharmacy.

## 2019-07-15 ENCOUNTER — Ambulatory Visit (INDEPENDENT_AMBULATORY_CARE_PROVIDER_SITE_OTHER): Payer: BC Managed Care – PPO | Admitting: Family Medicine

## 2019-07-15 ENCOUNTER — Encounter: Payer: Self-pay | Admitting: Family Medicine

## 2019-07-15 ENCOUNTER — Other Ambulatory Visit: Payer: Self-pay

## 2019-07-15 VITALS — Ht 65.0 in | Wt 300.0 lb

## 2019-07-15 DIAGNOSIS — R0981 Nasal congestion: Secondary | ICD-10-CM

## 2019-07-15 DIAGNOSIS — F418 Other specified anxiety disorders: Secondary | ICD-10-CM | POA: Diagnosis not present

## 2019-07-15 DIAGNOSIS — F419 Anxiety disorder, unspecified: Secondary | ICD-10-CM | POA: Diagnosis not present

## 2019-07-15 DIAGNOSIS — M2550 Pain in unspecified joint: Secondary | ICD-10-CM

## 2019-07-15 MED ORDER — LORAZEPAM 0.5 MG PO TABS: ORAL_TABLET | ORAL | 0 refills | Status: DC

## 2019-07-15 MED ORDER — BUSPIRONE HCL 15 MG PO TABS: 15.0000 mg | ORAL_TABLET | Freq: Two times a day (BID) | ORAL | 1 refills | Status: DC

## 2019-07-15 MED ORDER — FLUTICASONE PROPIONATE 50 MCG/ACT NA SUSP: 2.0000 | Freq: Every day | NASAL | 6 refills | Status: DC

## 2019-07-15 NOTE — Progress Notes (Signed)
Ht 5\' 5"  (1.651 m)   Wt 300 lb (136.1 kg)   LMP  (LMP Unknown)   BMI 49.92 kg/m    Subjective:    Patient ID: Jasmine Tucker, female    DOB: 1970/07/20, 49 y.o.   MRN: [id number]  HPI: Jasmine Tucker is a 49 y.o. female  Chief Complaint  Patient presents with  . Anxiety    Patient states increased anxiety ongoing 1 month. Would like to talk about a prescriptions for Lorazepam.   . Medication Refill    Needs refill on Meloxicam and Flonse    . This visit was completed via telephone due to the restrictions of the COVID-19 pandemic. All issues as above were discussed and addressed. Physical exam was done as above through visual confirmation on telephone. If it was felt that the patient should be evaluated in the office, they were directed there. The patient verbally consented to this visit. . Location of the patient: home . Location of the provider: work . Those involved with this call:  . Provider: Kathrine Haddock, DNP . CMA: Tiffany Reel, CMA . Front Desk/Registration: Jill Side  . Time spent on call: 15 minutes on the phone discussing health concerns. 5 minutes total spent in review of patient's record and preparation of their chart. I verified patient identity using two factors (patient name and date of birth). Patient consents verbally to being seen via telemedicine visit today.   About a month of worsening anxiety issues, frequent panic attacks. Had previously been doing fairly well and rarely needing to use the buspar but lately many stressful events have been happening in her life. Has been doing virtual counseling through the family crisis center which has been helpful. Denies SI/HI but having crying spells, lack of motivation in addition to her panic and anxiousness.   Depression screen [facility] 2/9 07/15/2019 05/09/2018 04/07/2018  Decreased Interest 1 1 0  Down, Depressed, Hopeless 2 1 1   PHQ - 2 Score 3 2 1   Altered sleeping 2 3 -  Tired, decreased energy 1 2 -    Change in appetite 0 0 -  Feeling bad or failure about yourself  1 0 -  Trouble concentrating 2 0 -  Moving slowly or fidgety/restless 0 0 -  Suicidal thoughts 0 0 -  PHQ-9 Score 9 7 -  Difficult doing work/chores Very difficult - -   GAD 7 : Generalized Anxiety Score 07/15/2019 05/09/2018 04/07/2018 09/26/2017  Nervous, Anxious, on Edge 3 1 2 3   Control/stop worrying 3 1 0 2  Worry too much - different things 3 1 2 2   Trouble relaxing 2 1 2 1   Restless 1 0 0 0  Easily annoyed or irritable 3 2 2 2   Afraid - awful might happen 2 1 2 2   Total GAD 7 Score 17 7 10 12   Anxiety Difficulty Very difficult Somewhat difficult Somewhat difficult Somewhat difficult     Having to take the meloxicam at least once a week right now, started a new job that's 12 hour shifts and has been having worsening pain.   Relevant past medical, surgical, family and social history reviewed and updated as indicated. Interim medical history since our last visit reviewed. Allergies and medications reviewed and updated.  Review of Systems  Per HPI unless specifically indicated above     Objective:    Ht 5\' 5"  (1.651 m)   Wt 300 lb (136.1 kg)   LMP  (LMP Unknown)   BMI  49.92 kg/m   Wt Readings from Last 3 Encounters:  07/15/19 300 lb (136.1 kg)  08/01/18 300 lb (136.1 kg)  05/09/18 (!) 315 lb (142.9 kg)    Physical Exam  Unable to perform PE due to patient lack of access to video technology for today's visit.   Results for orders placed or performed in visit on [id number]  Basic Metabolic Panel (BMET)  Result Value Ref Range   Glucose 77 65 - 99 mg/dL   BUN 17 6 - 24 mg/dL   Creatinine, Ser 0.53 (L) 0.57 - 1.00 mg/dL   GFR calc non Af Amer 112 >59 mL/min/1.73   GFR calc Af Amer 129 >59 mL/min/1.73   BUN/Creatinine Ratio 32 (H) 9 - 23   Sodium 142 134 - 144 mmol/L   Potassium 4.3 3.5 - 5.2 mmol/L   Chloride 103 96 - 106 mmol/L   CO2 23 20 - 29 mmol/L   Calcium 9.3 8.7 - 10.2 mg/dL       Assessment & Plan:   Problem List Items Addressed This Visit      Other   Chronic anxiety - Primary    Exacerbated by stressors. Start buspar BID consistently, ativan for rare prn use. Continue to monitor closely for benefit      Relevant Medications   busPIRone (BUSPAR) 15 MG tablet   LORazepam (ATIVAN) 0.5 MG tablet   Arthralgia    Continue meloxicam, diclofenac gel prn       Other Visit Diagnoses    Nasal congestion       OTC allergy regimen reviewed, f/u if not improving   Relevant Medications   fluticasone (FLONASE) 50 MCG/ACT nasal spray   Situational anxiety       PMP Aware reviewed; allow limited benzos for flying, anxiety associated with travel; cautioned about NO alcohol with this   Relevant Medications   busPIRone (BUSPAR) 15 MG tablet   LORazepam (ATIVAN) 0.5 MG tablet       Follow up plan: Return in about 6 weeks (around 08/26/2019) for Anxiety f/u.

## 2019-07-19 NOTE — Assessment & Plan Note (Signed)
Continue meloxicam, diclofenac gel prn

## 2019-07-19 NOTE — Assessment & Plan Note (Signed)
Exacerbated by stressors. Start buspar BID consistently, ativan for rare prn use. Continue to monitor closely for benefit

## 2019-08-31 IMAGING — MR MR BILATERAL BREAST WITHOUT AND WITH CONTRAST
10 of 15 series · 32 of 48 positions shown · IV contrast (multihance)
Comparison: Mammogram and ultrasound from [REDACTED] dated 10/04/2016

CLINICAL DATA: Spontaneous left nipple discharge for 3 months.
Dilated ducts are identified in upper-outer quadrant of the left
breast on mammogram and ultrasound. Question of acoustic shadowing
was not persistent for targeted ultrasound biopsy. Questioned
asymmetric density in the lateral portion of the left breast was not
persistent for targeted stereotactic guided core biopsy.

LABS:  None applicable
EXAM:
BILATERAL BREAST MRI WITH AND WITHOUT CONTRAST
TECHNIQUE: Multiplanar, multisequence MR images of both breasts were obtained
prior to and following the intravenous administration of 20 ml of
MultiHance.

[Series 3: t2_tirm_tra ipat (a-p) · axial · 3.1mm · 0.74mm/px · 1 of 75 slices shown]
[im 1/75]
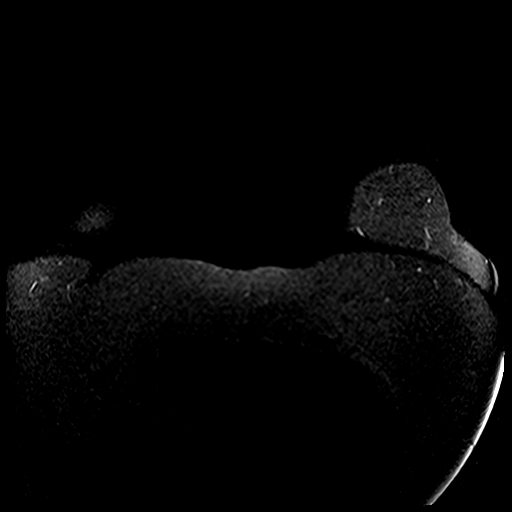

[Series 4: fl3d pre-cm no · axial · non-contrast · 1.3mm · 1.04mm/px · z∈[-129,+140]mm · 4 of 208 slices shown]
[im 1/208]
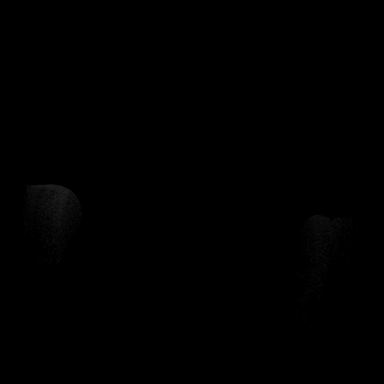
[im 70/208]
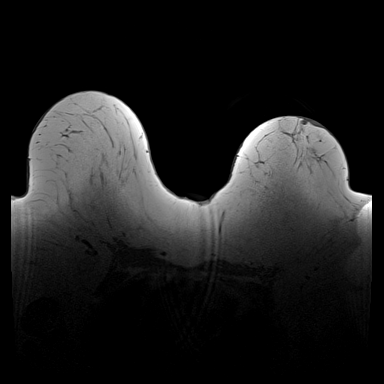
[im 139/208]
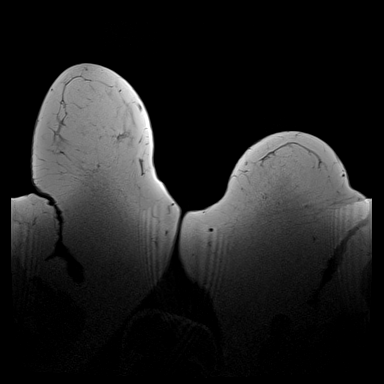
[im 208/208]
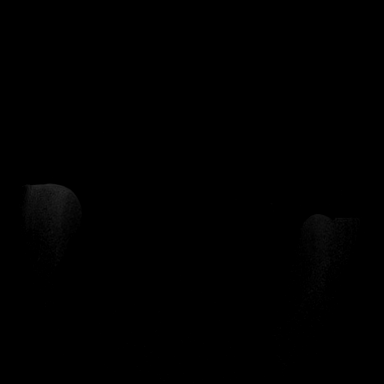

[Series 5: fl3d pre-cm · axial · non-contrast · 1.3mm · 1.04mm/px · z∈[-129,+140]mm · 4 of 208 slices shown]
[im 1/208]
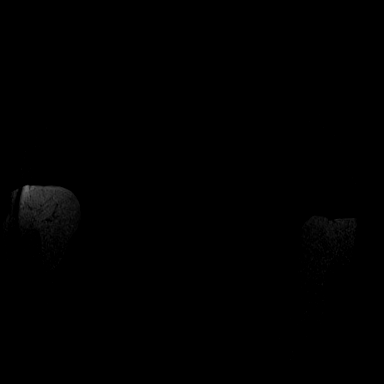
[im 70/208]
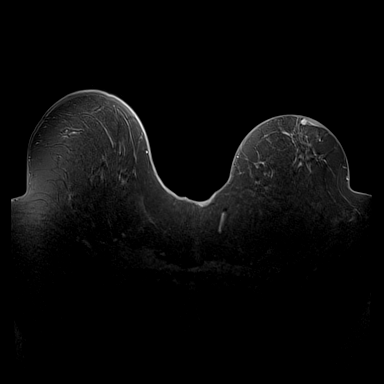
[im 139/208]
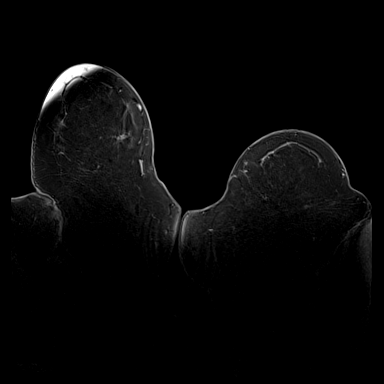
[im 208/208]
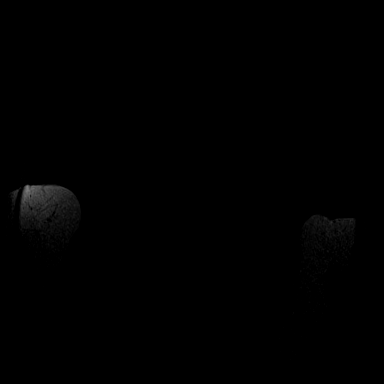

[Series 6: fl3d pre-cm_sub · axial · non-contrast · 1.3mm · 1.04mm/px · z∈[-129,+140]mm · 4 of 203 slices shown]
[im 1/203]
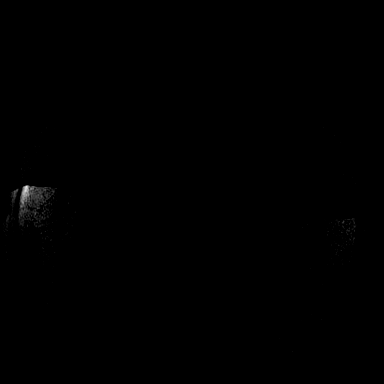
[im 68/203]
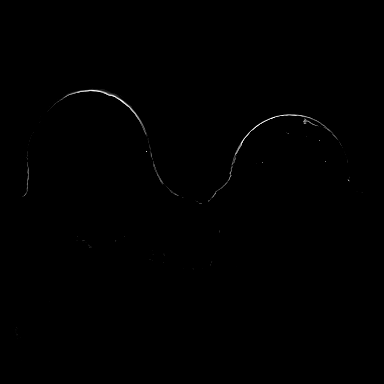
[im 135/203]
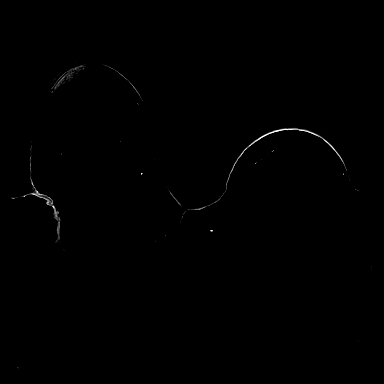
[im 203/203]
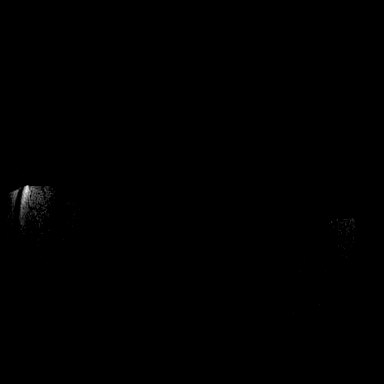

[Series 7: fl3d pre-cm_sub_mip_tra · axial · non-contrast · 270.4mm · 1.04mm/px · 1 of 1 slices shown]
[im 1/1]
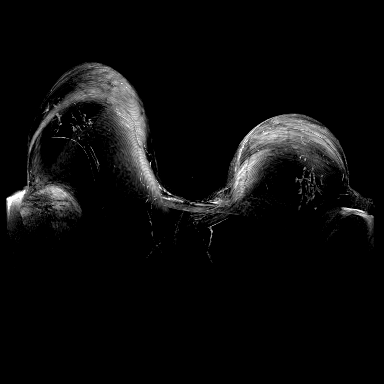

[Series 8: fl3d post-cm 20 · axial · 1.3mm · 1.04mm/px · z∈[-129,+140]mm · 5 of 208 slices shown (1 of 3)]
[im 1/208]
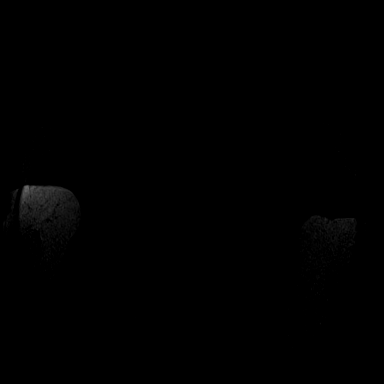
[im 52/208]
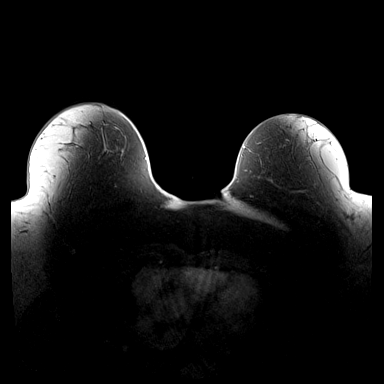
[im 104/208]
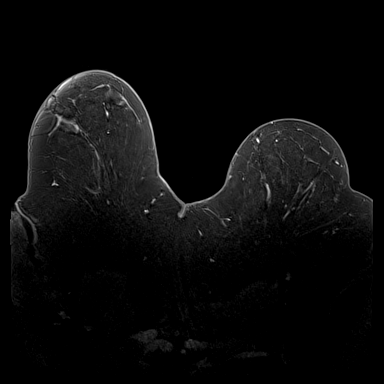
[im 156/208]
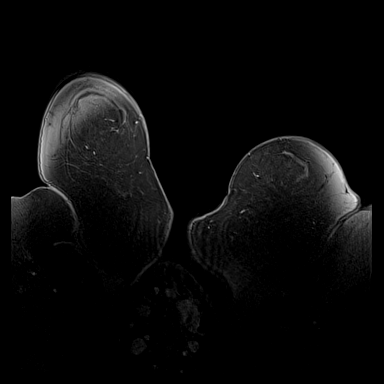
[im 208/208]
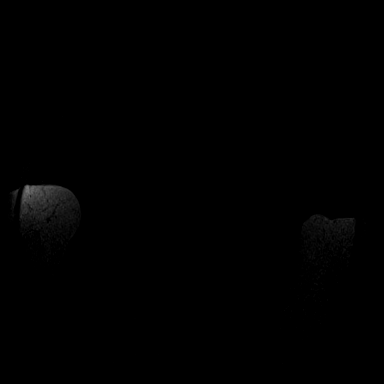

[Series 9: fl3d post-cm 20 · axial · 1.3mm · 1.04mm/px · z∈[-129,+140]mm · 5 of 205 slices shown (2 of 3)]
[im 1/205]
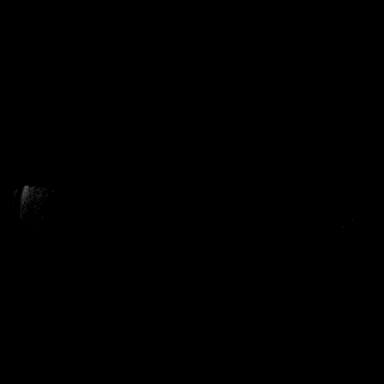
[im 52/205]
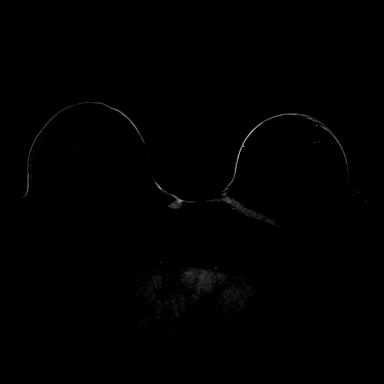
[im 103/205]
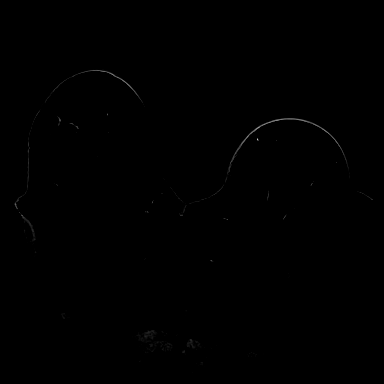
[im 154/205]
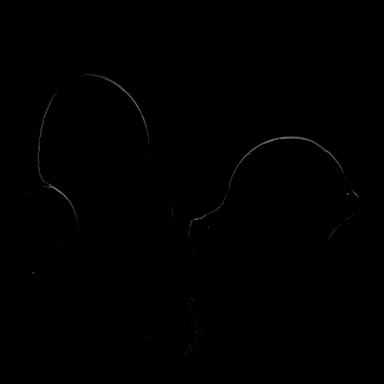
[im 205/205]
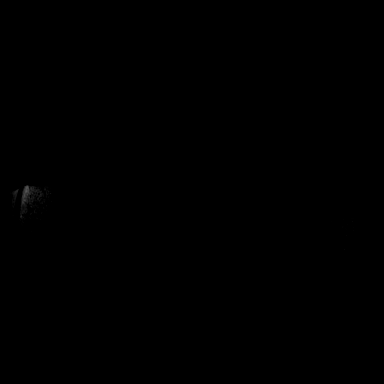

[Series 10: fl3d post-cm 20 · axial · 270.4mm · 1.04mm/px · 1 of 1 slices shown (3 of 3)]
[im 1/1]
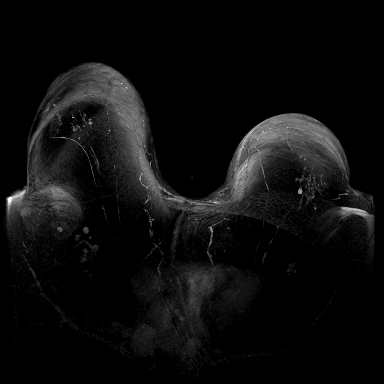

[Series 11: fl3d post-cm 3min · axial · 1.3mm · 1.04mm/px · z∈[-129,+140]mm · 5 of 208 slices shown]
[im 1/208]
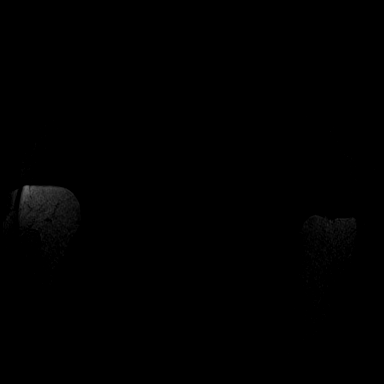
[im 52/208]
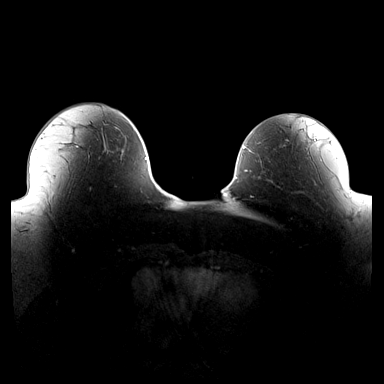
[im 104/208]
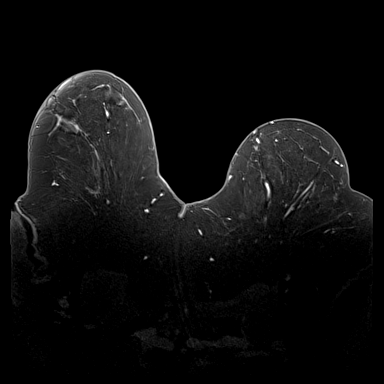
[im 156/208]
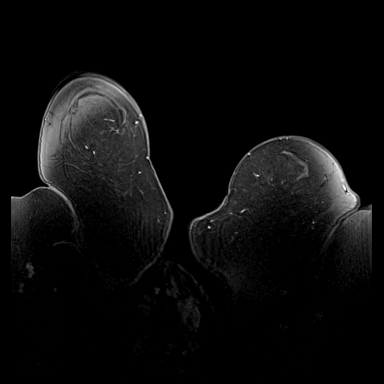
[im 208/208]
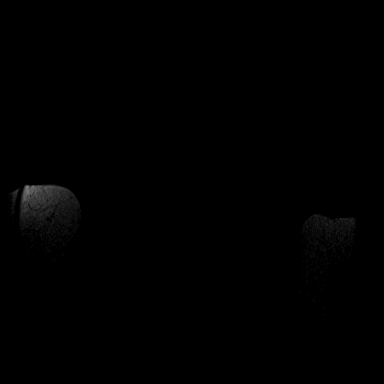

[Series 12: fl3d post-cm 3min_sub · axial · 1.3mm · 1.04mm/px · z∈[-129,-64]mm · 2 of 202 slices shown]
[im 1/202]
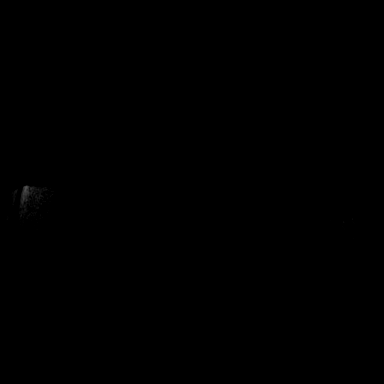
[im 51/202]
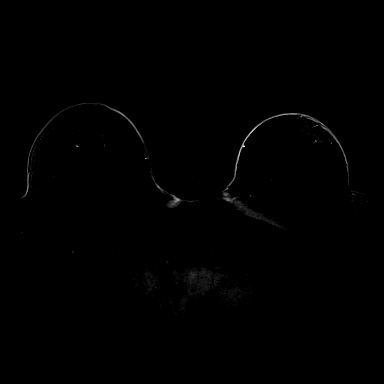

[32 of 48 positions shown; findings below may reference images not displayed]

THREE-DIMENSIONAL MR IMAGE RENDERING ON INDEPENDENT WORKSTATION:

Three-dimensional MR images were rendered by post-processing of the
original MR data on an independent workstation. The
three-dimensional MR images were interpreted, and findings are
reported in the following complete MRI report for this study. Three
dimensional images were evaluated at the independent [employer]
workstation
FINDINGS: Breast composition: b. Scattered fibroglandular tissue.

Background parenchymal enhancement: Mild

Right breast: No mass or abnormal enhancement.

Left breast: There is an ectatic ducts cyst on just lateral to the
left nipple, in the upper-outer quadrant. Along the lateral portion
of this duct system, there is a small round enhancing mass with
circumscribed margins which measures 7 x 8 mm and demonstrates
medium wash- in and persistent type enhancement kinetics.

Lymph nodes: No abnormal appearing lymph nodes.

Ancillary findings:  None.
IMPRESSION: 1. Dilated duct system associated with an 8 mm enhancing mass in the
upper-outer quadrant of the left breast, suspicious for papilloma.
2. Tissue diagnosis is recommended.
3. Right breast is negative.
4. No suspicious adenopathy.

RECOMMENDATION:
MR guided core biopsy of left breast mass is recommended.

BI-RADS CATEGORY  4: Suspicious.

## 2019-10-11 ENCOUNTER — Other Ambulatory Visit: Payer: Self-pay | Admitting: Family Medicine

## 2019-10-11 NOTE — Telephone Encounter (Signed)
Requested Prescriptions  Pending Prescriptions Disp Refills  . meloxicam (MOBIC) 15 MG tablet [Pharmacy Med Name: MELOXICAM 15MG  TABLETS] 90 tablet 0    Sig: TAKE 1 TABLET(15 MG) BY MOUTH DAILY AS NEEDED FOR PAIN     Analgesics:  COX2 Inhibitors Failed - 10/11/2019  9:01 AM      Failed - HGB in normal range and within 360 days    Hemoglobin  Date Value Ref Range Status  07/26/2018 14.9 12.0 - 15.0 g/dL Final  10/18/2014 11.4 11.1 - 15.9 g/dL Final         Failed - Cr in normal range and within 360 days    Creat  Date Value Ref Range Status  08/10/2016 0.48 (L) 0.50 - 1.10 mg/dL Final   Creatinine, Ser  Date Value Ref Range Status  07/26/2018 0.52 0.44 - 1.00 mg/dL Final         Passed - Patient is not pregnant      Passed - Valid encounter within last 12 months    Recent Outpatient Visits          2 months ago Chronic anxiety   [facility] [place], [place]   1 year ago Chest pain, unspecified type   [facility] [place], [place]   1 year ago NSAID long-term use   [place], [place], [place]   1 year ago Nasal congestion   [place], NP   2 years ago Situational anxiety   [facility] [place], Satira Anis, MD

## 2019-10-26 ENCOUNTER — Telehealth: Payer: Self-pay | Admitting: Family Medicine

## 2019-10-26 DIAGNOSIS — F418 Other specified anxiety disorders: Secondary | ICD-10-CM

## 2019-10-26 NOTE — Telephone Encounter (Signed)
Requested medication (s) are due for refill today: no  Requested medication (s) are on the active medication list: yes  Last refill:  512/21 #20 0 refills  Future visit scheduled: no  Notes to clinic:  not delegated per protocol, Patient states medication was destroyed and will need a refill, please advise      Requested Prescriptions  Pending Prescriptions Disp Refills   LORazepam (ATIVAN) 0.5 MG tablet 20 tablet 0    Sig: Take one tab daily as needed for severe panic attacks. Not to be used every day.      Not Delegated - Psychiatry:  Anxiolytics/Hypnotics Failed - 10/26/2019  1:27 PM      Failed - This refill cannot be delegated      Failed - Urine Drug Screen completed in last 360 days.      Passed - Valid encounter within last 6 months    Recent Outpatient Visits           3 months ago Chronic anxiety   [facility] Merrie Roof [place], [place]   1 year ago Chest pain, unspecified type   [facility] [place], [place]   1 year ago NSAID long-term use   [facility] Merrie Roof [place], [place]   1 year ago Nasal congestion   [place], NP   2 years ago Situational anxiety   [facility] [place], Satira Anis, MD

## 2019-10-26 NOTE — Telephone Encounter (Signed)
Medication Refill - Medication: LORazepam (ATIVAN) 0.5 MG tablet (Patinet stated that medciation was destroyed and will need a refill.   Has the patient contacted their pharmacy?yes (Agent: If no, request that the patient contact the pharmacy for the refill.) (Agent: If yes, when and what did the pharmacy advise?)contact pcp  Preferred Pharmacy (with phone number or street name):  [facility] DR[facility] STORE [place], [place] AT [address] Phone:  [phone]  Fax:  [phone]       Agent: Please be advised that RX refills may take up to 3 business days. We ask that you follow-up with your pharmacy.

## 2019-10-29 NOTE — Telephone Encounter (Signed)
Called to schedule, no answer, left vm 

## 2019-10-29 NOTE — Telephone Encounter (Signed)
As stated in the last refill request, needs appt. She never followed up as requested to see how her anxiety was doing and is overdue for this appt

## 2019-10-29 NOTE — Telephone Encounter (Signed)
Pt also wants to add the Meloxicam

## 2019-11-04 ENCOUNTER — Encounter: Payer: Self-pay | Admitting: Family Medicine

## 2019-11-04 ENCOUNTER — Telehealth (INDEPENDENT_AMBULATORY_CARE_PROVIDER_SITE_OTHER): Payer: BC Managed Care – PPO | Admitting: Family Medicine

## 2019-11-04 DIAGNOSIS — R0981 Nasal congestion: Secondary | ICD-10-CM | POA: Diagnosis not present

## 2019-11-04 DIAGNOSIS — F419 Anxiety disorder, unspecified: Secondary | ICD-10-CM | POA: Diagnosis not present

## 2019-11-04 DIAGNOSIS — F418 Other specified anxiety disorders: Secondary | ICD-10-CM | POA: Diagnosis not present

## 2019-11-04 MED ORDER — FLUTICASONE PROPIONATE 50 MCG/ACT NA SUSP: 2.0000 | Freq: Every day | NASAL | 3 refills | Status: DC

## 2019-11-04 MED ORDER — BUSPIRONE HCL 15 MG PO TABS: 15.0000 mg | ORAL_TABLET | Freq: Two times a day (BID) | ORAL | 1 refills | Status: DC

## 2019-11-04 MED ORDER — MELOXICAM 15 MG PO TABS: ORAL_TABLET | ORAL | 1 refills | Status: DC

## 2019-11-04 MED ORDER — LORAZEPAM 0.5 MG PO TABS: ORAL_TABLET | ORAL | 0 refills | Status: DC

## 2019-11-04 NOTE — Assessment & Plan Note (Signed)
Stable. Needs refill of her lorazepam due to it being destroyed. Will get her refill- but will need to last her at least 6 months to a year. Continue buspar. Call with any concerns. Follow up 6 months. Continue to monitor.

## 2019-11-04 NOTE — Progress Notes (Signed)
LMP  (LMP Unknown)    Subjective:    Patient ID: Jasmine Tucker, female    DOB: [birth year], 49 y.o.   MRN: [id number]  HPI: Jasmine Tucker is a 49 y.o. female  Chief Complaint  Patient presents with  . Anxiety   ANXIETY/STRESS- had to go on a training for work and left her lorazepam in the car and it turned to dust. Has felt like she needed to take her lorazepam 2x since May Duration: chronic Status:stable Anxious mood: yes  Excessive worrying: yes Irritability: yes  Sweating: no Nausea: no Palpitations:yes Hyperventilation: no Panic attacks: yes Agoraphobia: no  Obscessions/compulsions: no Depressed mood: yes Depression screen [facility] 2/9 11/04/2019 07/15/2019 05/09/2018 04/07/2018 09/26/2017  Decreased Interest 2 1 1  0 0  Down, Depressed, Hopeless 2 2 1 1 1   PHQ - 2 Score 4 3 2 1 1   Altered sleeping 2 2 3  - 1  Tired, decreased energy 0 1 2 - 0  Change in appetite 1 0 0 - 0  Feeling bad or failure about yourself  0 1 0 - 0  Trouble concentrating 0 2 0 - 0  Moving slowly or fidgety/restless 0 0 0 - 0  Suicidal thoughts 0 0 0 - 0  PHQ-9 Score 7 9 7  - 2  Difficult doing work/chores - Very difficult - - Not difficult at all   GAD 7 : Generalized Anxiety Score 11/04/2019 07/15/2019 05/09/2018 04/07/2018  Nervous, Anxious, on Edge 2 3 1 2   Control/stop worrying 3 3 1  0  Worry too much - different things 3 3 1 2   Trouble relaxing 3 2 1 2   Restless 0 1 0 0  Easily annoyed or irritable 3 3 2 2   Afraid - awful might happen 1 2 1 2   Total GAD 7 Score 15 17 7 10   Anxiety Difficulty Somewhat difficult Very difficult Somewhat difficult Somewhat difficult   Anhedonia: no Weight changes: no Insomnia: no   Hypersomnia: no Fatigue/loss of energy: yes Feelings of worthlessness: no Feelings of guilt: no Impaired concentration/indecisiveness: no Suicidal ideations: no  Crying spells: no Recent Stressors/Life Changes: yes   Relationship problems: yes   Family stress: yes      Financial stress: yes    Job stress: yes    Recent death/loss: no   Relevant past medical, surgical, family and social history reviewed and updated as indicated. Interim medical history since our last visit reviewed. Allergies and medications reviewed and updated.  Review of Systems  Constitutional: Negative.   Respiratory: Negative.   Cardiovascular: Negative.   Gastrointestinal: Negative.   Musculoskeletal: Negative.   Skin: Negative.   Neurological: Negative.   Psychiatric/Behavioral: Positive for dysphoric mood. Negative for agitation, behavioral problems, confusion, decreased concentration, hallucinations, self-injury, sleep disturbance and suicidal ideas. The patient is nervous/anxious. The patient is not hyperactive.     Per HPI unless specifically indicated above     Objective:    LMP  (LMP Unknown)   Wt Readings from Last 3 Encounters:  07/15/19 300 lb (136.1 kg)  08/01/18 300 lb (136.1 kg)  05/09/18 (!) 315 lb (142.9 kg)    Physical Exam Vitals and nursing note reviewed.  Constitutional:      General: She is not in acute distress.    Appearance: Normal appearance. She is not ill-appearing, toxic-appearing or diaphoretic.  HENT:     Head: Normocephalic and atraumatic.     Right Ear: External ear normal.     Left Ear: External  ear normal.     Nose: Nose normal.     Mouth/Throat:     Mouth: Mucous membranes are moist.     Pharynx: Oropharynx is clear.  Eyes:     General: No scleral icterus.       Right eye: No discharge.        Left eye: No discharge.     Conjunctiva/sclera: Conjunctivae normal.     Pupils: Pupils are equal, round, and reactive to light.  Pulmonary:     Effort: Pulmonary effort is normal. No respiratory distress.     Comments: Speaking in full sentences Musculoskeletal:        General: Normal range of motion.     Cervical back: Normal range of motion.  Skin:    Coloration: Skin is not jaundiced or pale.     Findings: No bruising,  erythema, lesion or rash.  Neurological:     Mental Status: She is alert and oriented to person, place, and time. Mental status is at baseline.  Psychiatric:        Mood and Affect: Mood normal.        Behavior: Behavior normal.        Thought Content: Thought content normal.        Judgment: Judgment normal.     Results for orders placed or performed in visit on 63/78/58  Basic Metabolic Panel (BMET)  Result Value Ref Range   Glucose 77 65 - 99 mg/dL   BUN 17 6 - 24 mg/dL   Creatinine, Ser 0.53 (L) 0.57 - 1.00 mg/dL   GFR calc non Af Amer 112 >59 mL/min/1.73   GFR calc Af Amer 129 >59 mL/min/1.73   BUN/Creatinine Ratio 32 (H) 9 - 23   Sodium 142 134 - 144 mmol/L   Potassium 4.3 3.5 - 5.2 mmol/L   Chloride 103 96 - 106 mmol/L   CO2 23 20 - 29 mmol/L   Calcium 9.3 8.7 - 10.2 mg/dL      Assessment & Plan:   Problem List Items Addressed This Visit      Other   Chronic anxiety    Stable. Needs refill of her lorazepam due to it being destroyed. Will get her refill- but will need to last her at least 6 months to a year. Continue buspar. Call with any concerns. Follow up 6 months. Continue to monitor.       Relevant Medications   LORazepam (ATIVAN) 0.5 MG tablet   busPIRone (BUSPAR) 15 MG tablet    Other Visit Diagnoses    Situational anxiety       PMP Aware reviewed; allow limited benzos for flying, anxiety associated with travel; cautioned about NO alcohol with this   Relevant Medications   LORazepam (ATIVAN) 0.5 MG tablet   busPIRone (BUSPAR) 15 MG tablet   Nasal congestion       OTC allergy regimen reviewed, f/u if not improving   Relevant Medications   fluticasone (FLONASE) 50 MCG/ACT nasal spray       Follow up plan: Return in about 6 months (around 05/03/2020) for physical.   . This visit was completed via MyChart due to the restrictions of the COVID-19 pandemic. All issues as above were discussed and addressed. Physical exam was done as above through visual  confirmation on MyChart. If it was felt that the patient should be evaluated in the office, they were directed there. The patient verbally consented to this visit. . Location of the patient: home .  Location of the provider: work . Those involved with this call:  . Provider: Park Liter, DO . CMA: Lauretta Grill, RMA . Front Desk/Registration: Don Perking  . Time spent on call: 15 minutes with patient face to face via video conference. More than 50% of this time was spent in counseling and coordination of care. 23 minutes total spent in review of patient's record and preparation of their chart.

## 2019-12-31 ENCOUNTER — Ambulatory Visit (INDEPENDENT_AMBULATORY_CARE_PROVIDER_SITE_OTHER): Payer: BC Managed Care – PPO | Admitting: Advanced Practice Midwife

## 2019-12-31 ENCOUNTER — Other Ambulatory Visit: Payer: Self-pay

## 2019-12-31 ENCOUNTER — Other Ambulatory Visit (HOSPITAL_COMMUNITY)
Admission: RE | Admit: 2019-12-31 | Discharge: 2019-12-31 | Disposition: A | Discharge status: Home / Self Care | Payer: Self-pay | Source: Ambulatory Visit | Attending: Advanced Practice Midwife | Admitting: Advanced Practice Midwife

## 2019-12-31 ENCOUNTER — Encounter: Payer: Self-pay | Admitting: Advanced Practice Midwife

## 2019-12-31 VITALS — BP 127/86 | HR 73 | Resp 18 | Ht 64.0 in | Wt 272.0 lb

## 2019-12-31 DIAGNOSIS — N898 Other specified noninflammatory disorders of vagina: Secondary | ICD-10-CM | POA: Insufficient documentation

## 2019-12-31 DIAGNOSIS — Z113 Encounter for screening for infections with a predominantly sexual mode of transmission: Secondary | ICD-10-CM

## 2019-12-31 DIAGNOSIS — R300 Dysuria: Secondary | ICD-10-CM

## 2019-12-31 LAB — POCT URINALYSIS DIPSTICK
Bilirubin, UA: NEGATIVE
Blood, UA: POSITIVE
Glucose, UA: NEGATIVE
Nitrite, UA: NEGATIVE
Protein, UA: NEGATIVE
Spec Grav, UA: >=1.03 — AB (ref 1.010–1.025)
Urobilinogen, UA: 1 E.U./dL
pH, UA: 7 (ref 5.0–8.0)

## 2019-12-31 NOTE — Patient Instructions (Signed)
Vaginitis Vaginitis is a condition in which the vaginal tissue swells and becomes red (inflamed). This condition is most often caused by a change in the normal balance of bacteria and yeast that live in the vagina. This change causes an overgrowth of certain bacteria or yeast, which causes the inflammation. There are different types of vaginitis, but the most common types are:  Bacterial vaginosis.  Yeast infection (candidiasis).  Trichomoniasis vaginitis. This is a sexually transmitted disease (STD).  Viral vaginitis.  Atrophic vaginitis.  Allergic vaginitis. What are the causes? The cause of this condition depends on the type of vaginitis. It can be caused by:  Bacteria (bacterial vaginosis).  Yeast, which is a fungus (yeast infection).  A parasite (trichomoniasis vaginitis).  A virus (viral vaginitis).  Low hormone levels (atrophic vaginitis). Low hormone levels can occur during pregnancy, breastfeeding, or after menopause.  Irritants, such as bubble baths, scented tampons, and feminine sprays (allergic vaginitis). Other factors can change the normal balance of the yeast and bacteria that live in the vagina. These include:  Antibiotic medicines.  Poor hygiene.  Diaphragms, vaginal sponges, spermicides, birth control pills, and intrauterine devices (IUD).  Sex.  Infection.  Uncontrolled diabetes.  A weakened defense (immune) system. What increases the risk? This condition is more likely to develop in women who:  Smoke.  Use vaginal douches, scented tampons, or scented sanitary pads.  Wear tight-fitting pants.  Wear thong underwear.  Use oral birth control pills or an IUD.  Have sex without a condom.  Have multiple sex partners.  Have an STD.  Frequently use the spermicide nonoxynol-9.  Eat lots of foods high in sugar.  Have uncontrolled diabetes.  Have low estrogen levels.  Have a weakened immune system from an immune disorder or medical  treatment.  Are pregnant or breastfeeding. What are the signs or symptoms? Symptoms vary depending on the cause of the vaginitis. Common symptoms include:  Abnormal vaginal discharge. ? The discharge is white, gray, or yellow with bacterial vaginosis. ? The discharge is thick, white, and cheesy with a yeast infection. ? The discharge is frothy and yellow or greenish with trichomoniasis.  A bad vaginal smell. The smell is fishy with bacterial vaginosis.  Vaginal itching, pain, or swelling.  Sex that is painful.  Pain or burning when urinating. Sometimes there are no symptoms. How is this diagnosed? This condition is diagnosed based on your symptoms and medical history. A physical exam, including a pelvic exam, will also be done. You may also have other tests, including:  Tests to determine the pH level (acidity or alkalinity) of your vagina.  A whiff test, to assess the odor that results when a sample of your vaginal discharge is mixed with a potassium hydroxide solution.  Tests of vaginal fluid. A sample will be examined under a microscope. How is this treated? Treatment varies depending on the type of vaginitis you have. Your treatment may include:  Antibiotic creams or pills to treat bacterial vaginosis and trichomoniasis.  Antifungal medicines, such as vaginal creams or suppositories, to treat a yeast infection.  Medicine to ease discomfort if you have viral vaginitis. Your sexual partner should also be treated.  Estrogen delivered in a cream, pill, suppository, or vaginal ring to treat atrophic vaginitis. If vaginal dryness occurs, lubricants and moisturizing creams may help. You may need to avoid scented soaps, sprays, or douches.  Stopping use of a product that is causing allergic vaginitis. Then using a vaginal cream to treat the symptoms. Follow   these instructions at home: Lifestyle  Keep your genital area clean and dry. Avoid soap, and only rinse the area with  water.  Do not douche or use tampons until your health care provider says it is okay to do so. Use sanitary pads, if needed.  Do not have sex until your health care provider approves. When you can return to sex, practice safe sex and use condoms.  Wipe from front to back. This avoids the spread of bacteria from the rectum to the vagina. General instructions  Take over-the-counter and prescription medicines only as told by your health care provider.  If you were prescribed an antibiotic medicine, take or use it as told by your health care provider. Do not stop taking or using the antibiotic even if you start to feel better.  Keep all follow-up visits as told by your health care provider. This is important. How is this prevented?  Use mild, non-scented products. Do not use things that can irritate the vagina, such as fabric softeners. Avoid the following products if they are scented: ? Feminine sprays. ? Detergents. ? Tampons. ? Feminine hygiene products. ? Soaps or bubble baths.  Let air reach your genital area. ? Wear cotton underwear to reduce moisture buildup. ? Avoid wearing underwear while you sleep. ? Avoid wearing tight pants and underwear or nylons without a cotton panel. ? Avoid wearing thong underwear.  Take off any wet clothing, such as bathing suits, as soon as possible.  Practice safe sex and use condoms. Contact a health care provider if:  You have abdominal pain.  You have a fever.  You have symptoms that last for more than 2-3 days. Get help right away if:  You have a fever and your symptoms suddenly get worse. Summary  Vaginitis is a condition in which the vaginal tissue becomes inflamed.This condition is most often caused by a change in the normal balance of bacteria and yeast that live in the vagina.  Treatment varies depending on the type of vaginitis you have.  Do not douche, use tampons , or have sex until your health care provider approves. When  you can return to sex, practice safe sex and use condoms. This information is not intended to replace advice given to you by your health care provider. Make sure you discuss any questions you have with your health care provider. Document Revised: 02/01/2017 Document Reviewed: 03/27/2016 Elsevier Patient Education  2020 [place]. Atrophic Vaginitis  Atrophic vaginitis is a condition in which the tissues that line the vagina become dry and thin. This condition is most common in women who have stopped having regular menstrual periods (are in menopause). This usually starts when a woman is 45-93 years old. That is the time when a woman's estrogen levels begin to drop (decrease). Estrogen is a female hormone. It helps to keep the tissues of the vagina moist. It stimulates the vagina to produce a clear fluid that lubricates the vagina for sexual intercourse. This fluid also protects the vagina from infection. Lack of estrogen can cause the lining of the vagina to get thinner and dryer. The vagina may also shrink in size. It may become less elastic. Atrophic vaginitis tends to get worse over time as a woman's estrogen level drops. What are the causes? This condition is caused by the normal drop in estrogen that happens around the time of menopause. What increases the risk? Certain conditions or situations may lower a woman's estrogen level, leading to a higher risk for  atrophic vaginitis. You are more likely to develop this condition if:  You are taking medicines that block estrogen.  You have had your ovaries removed.  You are being treated for cancer with X-ray (radiation) or medicines (chemotherapy).  You have given birth or are breastfeeding.  You are older than age 82.  You smoke. What are the signs or symptoms? Symptoms of this condition include:  Pain, soreness, or bleeding during sexual intercourse (dyspareunia).  Vaginal burning, irritation, or itching.  Pain or bleeding when a  speculum is used in a vaginal exam (pelvic exam).  Having burning pain when passing urine.  Vaginal discharge that is brown or yellow. In some cases, there are no symptoms. How is this diagnosed? This condition is diagnosed by taking a medical history and doing a physical exam. This will include a pelvic exam that checks the vaginal tissues. Though rare, you may also have other tests, including:  A urine test.  A test that checks the acid balance in your vagina (acid balance test). How is this treated? Treatment for this condition depends on how severe your symptoms are. Treatment may include:  Using an over-the-counter vaginal lubricant before sex.  Using a long-acting vaginal moisturizer.  Using low-dose vaginal estrogen for moderate to severe symptoms that do not respond to other treatments. Options include creams, tablets, and inserts (vaginal rings). Before you use a vaginal estrogen, tell your health care provider if you have a history of: ? Breast cancer. ? Endometrial cancer. ? Blood clots. If you are not sexually active and your symptoms are very mild, you may not need treatment. Follow these instructions at home: Medicines  Take over-the-counter and prescription medicines only as told by your health care provider. Do not use herbal or alternative medicines unless your health care provider says that you can.  Use over-the-counter creams, lubricants, or moisturizers for dryness only as directed by your health care provider. General instructions  If your atrophic vaginitis is caused by menopause, discuss all of your menopause symptoms and treatment options with your health care provider.  Do not douche.  Do not use products that can make your vagina dry. These include: ? Scented feminine sprays. ? Scented tampons. ? Scented soaps.  Vaginal intercourse can help to improve blood flow and elasticity of vaginal tissue. If it hurts to have sex, try using a lubricant or  moisturizer just before having intercourse. Contact a health care provider if:  Your discharge looks different than normal.  Your vagina has an unusual smell.  You have new symptoms.  Your symptoms do not improve with treatment.  Your symptoms get worse. Summary  Atrophic vaginitis is a condition in which the tissues that line the vagina become dry and thin. It is most common in women who have stopped having regular menstrual periods (are in menopause).  Treatment options include using vaginal lubricants and low-dose vaginal estrogen.  Contact a health care provider if your vagina has an unusual smell, or if your symptoms get worse or do not improve after treatment. This information is not intended to replace advice given to you by your health care provider. Make sure you discuss any questions you have with your health care provider. Document Revised: 02/01/2017 Document Reviewed: 11/15/2016 Elsevier Patient Education  2020 [place]. Menopause Menopause is the normal time of life when menstrual periods stop completely. It is usually confirmed by 12 months without a menstrual period. The transition to menopause (perimenopause) most often happens between the ages of  45 and 55. During perimenopause, hormone levels change in your body, which can cause symptoms and affect your health. Menopause may increase your risk for:  Loss of bone (osteoporosis), which causes bone breaks (fractures).  Depression.  Hardening and narrowing of the arteries (atherosclerosis), which can cause heart attacks and strokes. What are the causes? This condition is usually caused by a natural change in hormone levels that happens as you get older. The condition may also be caused by surgery to remove both ovaries (bilateral oophorectomy). What increases the risk? This condition is more likely to start at an earlier age if you have certain medical conditions or treatments, including:  A tumor of the  pituitary gland in the brain.  A disease that affects the ovaries and hormone production.  Radiation treatment for cancer.  Certain cancer treatments, such as chemotherapy or hormone (anti-estrogen) therapy.  Heavy smoking and excessive alcohol use.  Family history of early menopause. This condition is also more likely to develop earlier in women who are very thin. What are the signs or symptoms? Symptoms of this condition include:  Hot flashes.  Irregular menstrual periods.  Night sweats.  Changes in feelings about sex. This could be a decrease in sex drive or an increased comfort around your sexuality.  Vaginal dryness and thinning of the vaginal walls. This may cause painful intercourse.  Dryness of the skin and development of wrinkles.  Headaches.  Problems sleeping (insomnia).  Mood swings or irritability.  Memory problems.  Weight gain.  Hair growth on the face and chest.  Bladder infections or problems with urinating. How is this diagnosed? This condition is diagnosed based on your medical history, a physical exam, your age, your menstrual history, and your symptoms. Hormone tests may also be done. How is this treated? In some cases, no treatment is needed. You and your health care provider should make a decision together about whether treatment is necessary. Treatment will be based on your individual condition and preferences. Treatment for this condition focuses on managing symptoms. Treatment may include:  Menopausal hormone therapy (MHT).  Medicines to treat specific symptoms or complications.  Acupuncture.  Vitamin or herbal supplements. Before starting treatment, make sure to let your health care provider know if you have a personal or family history of:  Heart disease.  Breast cancer.  Blood clots.  Diabetes.  Osteoporosis. Follow these instructions at home: Lifestyle  Do not use any products that contain nicotine or tobacco, such as  cigarettes and e-cigarettes. If you need help quitting, ask your health care provider.  Get at least 30 minutes of physical activity on 5 or more days each week.  Avoid alcoholic and caffeinated beverages, as well as spicy foods. This may help prevent hot flashes.  Get 7-8 hours of sleep each night.  If you have hot flashes, try: ? Dressing in layers. ? Avoiding things that may trigger hot flashes, such as spicy food, warm places, or stress. ? Taking slow, deep breaths when a hot flash starts. ? Keeping a fan in your home and office.  Find ways to manage stress, such as deep breathing, meditation, or journaling.  Consider going to group therapy with other women who are having menopause symptoms. Ask your health care provider about recommended group therapy meetings. Eating and drinking  Eat a healthy, balanced diet that contains whole grains, lean protein, low-fat dairy, and plenty of fruits and vegetables.  Your health care provider may recommend adding more soy to your diet. Foods  that contain soy include tofu, tempeh, and soy milk.  Eat plenty of foods that contain calcium and vitamin D for bone health. Items that are rich in calcium include low-fat milk, yogurt, beans, almonds, sardines, broccoli, and kale. Medicines  Take over-the-counter and prescription medicines only as told by your health care provider.  Talk with your health care provider before starting any herbal supplements. If prescribed, take vitamins and supplements as told by your health care provider. These may include: ? Calcium. Women age 66 and older should get 1,200 mg (milligrams) of calcium every day. ? Vitamin D. Women need 600-800 International Units of vitamin D each day. ? Vitamins B12 and B6. Aim for 50 micrograms of B12 and 1.5 mg of B6 each day. General instructions  Keep track of your menstrual periods, including: ? When they occur. ? How heavy they are and how long they last. ? How much time  passes between periods.  Keep track of your symptoms, noting when they start, how often you have them, and how long they last.  Use vaginal lubricants or moisturizers to help with vaginal dryness and improve comfort during sex.  Keep all follow-up visits as told by your health care provider. This is important. This includes any group therapy or counseling. Contact a health care provider if:  You are still having menstrual periods after age 46.  You have pain during sex.  You have not had a period for 12 months and you develop vaginal bleeding. Get help right away if:  You have: ? Severe depression. ? Excessive vaginal bleeding. ? Pain when you urinate. ? A fast or irregular heart beat (palpitations). ? Severe headaches. ? Abdomen (abdominal) pain or severe indigestion.  You fell and you think you have a broken bone.  You develop leg or chest pain.  You develop vision problems.  You feel a lump in your breast. Summary  Menopause is the normal time of life when menstrual periods stop completely. It is usually confirmed by 12 months without a menstrual period.  The transition to menopause (perimenopause) most often happens between the ages of 64 and 58.  Symptoms can be managed through medicines, lifestyle changes, and complementary therapies such as acupuncture.  Eat a balanced diet that is rich in nutrients to promote bone health and heart health and to manage symptoms during menopause. This information is not intended to replace advice given to you by your health care provider. Make sure you discuss any questions you have with your health care provider. Document Revised: 02/01/2017 Document Reviewed: 03/24/2016 Elsevier Patient Education  2020 [employer].

## 2019-12-31 NOTE — Progress Notes (Signed)
Patient ID: CHAREESE Tucker, female   DOB: 05/04/1970, 49 y.o.   MRN: [id number]  Reason for Consult: Gynecologic Exam (irritation / std check possible UTI.)   Subjective:  HPI:  Jasmine Tucker is a 49 y.o. female being seen for vaginal irritation ongoing for 3 weeks. About 4 years ago she had a total hysterectomy. She had unprotected intercourse for the first time since then about 3 weeks ago. She had pain with penetration and has had irritation since then. She denies itching, odor, discharge. She mentions some pain with urination and wonders if she has a UTI. She would like to be tested for STDs/Vaginitis. We discussed possible atrophic vaginitis secondary to bilateral oophorectomy.   Past Medical History:  Diagnosis Date  . Anemia   . Anxiety   . Family history of breast cancer   . Family history of ovarian cancer   . GERD (gastroesophageal reflux disease)   . Menorrhagia   . Plantar fasciitis 2017  . Thrombocytosis    Family History  Problem Relation Age of Onset  . Ovarian cancer Mother 28  . Breast cancer Maternal Aunt 65  . Cancer Paternal Aunt        either cervical or ovarian cancer  . Cervical cancer Maternal Aunt        reportedly the cause of death was cervical cancer  . Lung cancer Paternal Aunt   . Breast cancer Cousin   . Breast cancer Cousin    Past Surgical History:  Procedure Laterality Date  . ABDOMINAL HYSTERECTOMY    . BREAST SURGERY     breast biopsy 4 months ago, left breast  . CHOLECYSTECTOMY    . GASTRIC BYPASS  2000  . LAPAROSCOPIC BILATERAL SALPINGECTOMY Bilateral 07/20/2014   Procedure: LAPAROSCOPIC BILATERAL SALPINGECTOMY;  Surgeon: Gae Dry, MD;  Location: [facility];  Service: Gynecology;  Laterality: Bilateral;  . LAPAROSCOPIC HYSTERECTOMY N/A 07/20/2014   Procedure: HYSTERECTOMY TOTAL LAPAROSCOPIC;  Surgeon: Gae Dry, MD;  Location: [facility];  Service: Gynecology;  Laterality: N/A;  . LAPAROSCOPIC OOPHERECTOMY Bilateral  07/20/2014   Procedure: LAPAROSCOPIC OOPHERECTOMY-(POSSIBLE);  Surgeon: Gae Dry, MD;  Location: [facility];  Service: Gynecology;  Laterality: Bilateral;    Short Social History:  Social History   Tobacco Use  . Smoking status: Never Smoker  . Smokeless tobacco: Never Used  Substance Use Topics  . Alcohol use: Yes    Comment: socially    Allergies  Allergen Reactions  . Latex Itching and Rash    Current Outpatient Medications  Medication Sig Dispense Refill  . busPIRone (BUSPAR) 15 MG tablet Take 1 tablet (15 mg total) by mouth 2 (two) times daily. 180 tablet 1  . fluticasone (FLONASE) 50 MCG/ACT nasal spray Place 2 sprays into both nostrils daily. 48 g 3  . LORazepam (ATIVAN) 0.5 MG tablet Take one tab daily as needed for severe panic attacks. Not to be used every day. 20 tablet 0  . meloxicam (MOBIC) 15 MG tablet TAKE 1 TABLET(15 MG) BY MOUTH DAILY AS NEEDED FOR PAIN 90 tablet 1   No current facility-administered medications for this visit.    Review of Systems  Constitutional: Negative for chills and fever.  HENT: Negative for congestion, ear discharge, ear pain, hearing loss, sinus pain and sore throat.   Eyes: Negative for blurred vision and double vision.  Respiratory: Negative for cough, shortness of breath and wheezing.   Cardiovascular: Negative for chest pain, palpitations and leg swelling.  Gastrointestinal:  Negative for abdominal pain, blood in stool, constipation, diarrhea, heartburn, melena, nausea and vomiting.  Genitourinary: Positive for dysuria. Negative for flank pain, frequency, hematuria and urgency.       Positive for vaginal irritation  Musculoskeletal: Negative for back pain, joint pain and myalgias.  Skin: Negative for itching and rash.  Neurological: Negative for dizziness, tingling, tremors, sensory change, speech change, focal weakness, seizures, loss of consciousness, weakness and headaches.  Endo/Heme/Allergies: Negative for environmental  allergies. Does not bruise/bleed easily.  Psychiatric/Behavioral: Negative for depression, hallucinations, memory loss, substance abuse and suicidal ideas. The patient is not nervous/anxious and does not have insomnia.         Objective:  Objective   Vitals:   12/31/19 1016  BP: 127/86  Pulse: 73  Resp: 18  SpO2: 97%  Weight: 272 lb (123.4 kg)  Height: 5\' 4"  (1.626 m)   Body mass index is 46.69 kg/m. Constitutional: Well nourished, well developed female in no acute distress.  HEENT: normal Skin: Warm and dry.  Cardiovascular: Regular rate and rhythm.   Respiratory: Clear to auscultation bilateral. Normal respiratory effort Back: no CVAT Neuro: DTRs 2+, Cranial nerves grossly intact Psych: Alert and Oriented x3. No memory deficits. Normal mood and affect.  MS: normal gait, normal bilateral lower extremity ROM/strength/stability.  Pelvic exam: is not limited by body habitus EGBUS: within normal limits Vagina: within normal limits and with thinning/irritated appearing vaginal mucosa  Results for GALADRIEL, SHROFF (MRN [id number]) as of 12/31/2019 13:25  Ref. Range 12/31/2019 10:36  Bilirubin, UA Unknown NEGATIVE  Glucose Latest Ref Range: Negative  Negative  Ketones, UA Unknown large  Leukocytes,UA Latest Ref Range: Negative  Moderate (2+) (A)  Nitrite, UA Unknown negative  pH, UA Latest Ref Range: 5.0 - 8.0  7.0  Protein,UA Latest Ref Range: Negative  Negative  Specific Gravity, UA Latest Ref Range: 1.010 - 1.025  >=1.030 (A)  Urobilinogen, UA Latest Ref Range: 0.2 or 1.0 E.U./dL 1.0  RBC, UA Unknown positive    Assessment/Plan:     49 y.o. G2 P22 female with vaginitis: atrophic vs BV, STD testing, possible UTI  Aptima: vaginitis/STD Urine culture STD blood work Follow up as needed after labs result   [facility] 12/31/2019, 1:28 PM

## 2019-12-31 NOTE — Progress Notes (Signed)
Pt state she has vaginal irritation and wants to have a STD check. Pt thinks she may have a possible UTI.

## 2020-01-01 LAB — RPR QUALITATIVE: RPR Ser Ql: NONREACTIVE

## 2020-01-01 LAB — HEPATITIS PANEL, ACUTE
Hep A IgM: NEGATIVE
Hep B C IgM: NEGATIVE
Hep C Virus Ab: <0.1 s/co ratio (ref 0.0–0.9)
Hepatitis B Surface Ag: NEGATIVE

## 2020-01-01 LAB — HIV ANTIBODY (ROUTINE TESTING W REFLEX): HIV Screen 4th Generation wRfx: NONREACTIVE

## 2020-01-02 LAB — URINE CULTURE

## 2020-01-04 ENCOUNTER — Other Ambulatory Visit: Payer: Self-pay | Admitting: Advanced Practice Midwife

## 2020-01-04 DIAGNOSIS — B9689 Other specified bacterial agents as the cause of diseases classified elsewhere: Secondary | ICD-10-CM

## 2020-01-04 DIAGNOSIS — N760 Acute vaginitis: Secondary | ICD-10-CM

## 2020-01-04 DIAGNOSIS — B373 Candidiasis of vulva and vagina: Secondary | ICD-10-CM

## 2020-01-04 DIAGNOSIS — B3731 Acute candidiasis of vulva and vagina: Secondary | ICD-10-CM

## 2020-01-04 LAB — CERVICOVAGINAL ANCILLARY ONLY
Bacterial Vaginitis (gardnerella): POSITIVE — AB
Candida Glabrata: POSITIVE — AB
Candida Vaginitis: POSITIVE — AB
Chlamydia: NEGATIVE
Comment: NEGATIVE
Comment: NEGATIVE
Comment: NEGATIVE
Comment: NEGATIVE
Comment: NEGATIVE
Comment: NORMAL
Neisseria Gonorrhea: NEGATIVE
Trichomonas: NEGATIVE

## 2020-01-04 LAB — URINE CULTURE

## 2020-01-04 MED ORDER — METRONIDAZOLE 500 MG PO TABS: 500.0000 mg | ORAL_TABLET | Freq: Two times a day (BID) | ORAL | 0 refills | Status: AC

## 2020-01-04 MED ORDER — FLUCONAZOLE 150 MG PO TABS: 150.0000 mg | ORAL_TABLET | Freq: Once | ORAL | 3 refills | Status: AC

## 2020-01-04 NOTE — Progress Notes (Signed)
Rx's metronidazole and diflucan sent for BV and Yeast infections. Message sent to patient regarding diagnoses and Rx's.

## 2020-01-07 ENCOUNTER — Other Ambulatory Visit: Payer: Self-pay | Admitting: Advanced Practice Midwife

## 2020-01-07 DIAGNOSIS — N3000 Acute cystitis without hematuria: Secondary | ICD-10-CM

## 2020-01-07 MED ORDER — PENICILLIN V POTASSIUM 250 MG PO TABS: 250.0000 mg | ORAL_TABLET | Freq: Four times a day (QID) | ORAL | 0 refills | Status: DC

## 2020-01-07 NOTE — Progress Notes (Signed)
Rx penicillin sent to treat UTI. Message sent to patient.

## 2020-01-08 ENCOUNTER — Other Ambulatory Visit: Payer: Self-pay | Admitting: Advanced Practice Midwife

## 2020-01-08 ENCOUNTER — Encounter: Payer: Self-pay | Admitting: Family Medicine

## 2020-01-08 DIAGNOSIS — Z0189 Encounter for other specified special examinations: Secondary | ICD-10-CM

## 2020-01-08 NOTE — Progress Notes (Signed)
Urine culture ordered and collected at recent visit ([place] office). [employer] also ordered a culture and collected ([place] location). The results of the two tests are conflicting (one normal and one abnormal). Patient would like to send culture one more time for clarification. Advised her to call office for nurse only visit. Informed her of proper collection technique. She denies any current symptoms.

## 2020-01-08 NOTE — Telephone Encounter (Signed)
Scheduled with jolene 11/10

## 2020-01-13 ENCOUNTER — Other Ambulatory Visit: Payer: Self-pay

## 2020-01-13 ENCOUNTER — Ambulatory Visit: Payer: BC Managed Care – PPO | Admitting: Nurse Practitioner

## 2020-01-13 ENCOUNTER — Encounter: Payer: Self-pay | Admitting: Nurse Practitioner

## 2020-01-13 VITALS — BP 123/82 | HR 69 | Temp 98.2°F | Wt 276.2 lb

## 2020-01-13 DIAGNOSIS — N898 Other specified noninflammatory disorders of vagina: Secondary | ICD-10-CM | POA: Diagnosis not present

## 2020-01-13 LAB — UA/M W/RFLX CULTURE, ROUTINE
Bilirubin, UA: NEGATIVE
Glucose, UA: NEGATIVE
Leukocytes,UA: NEGATIVE
Nitrite, UA: NEGATIVE
Specific Gravity, UA: >1.03 — ABNORMAL HIGH (ref 1.005–1.030)
Urobilinogen, Ur: >8 mg/dL — ABNORMAL HIGH (ref 0.2–1.0)
pH, UA: 6 (ref 5.0–7.5)

## 2020-01-13 LAB — MICROSCOPIC EXAMINATION: Bacteria, UA: NONE SEEN

## 2020-01-13 LAB — WET PREP FOR TRICH, YEAST, CLUE
Clue Cell Exam: POSITIVE — AB
Trichomonas Exam: NEGATIVE
Yeast Exam: POSITIVE — AB

## 2020-01-13 NOTE — Progress Notes (Signed)
BP 123/82   Pulse 69   Temp 98.2 F (36.8 C)   Wt 276 lb 3.2 oz (125.3 kg)   LMP  (LMP Unknown)   SpO2 96%   BMI 47.41 kg/m    Subjective:    Patient ID: Jasmine Tucker, female    DOB: 1971-01-16, 49 y.o.   MRN: [id number]  HPI: Jasmine Tucker is a 49 y.o. female  Chief Complaint  Patient presents with  . Urinary Tract Infection    pt states she was seen at her OBGYN and was told she had a UTI, BV, and yeast infection. Pt states cultures state otherwise, pt is confused on results and what meds to take    URINARY SYMPTOMS Presents today due to being told by her GYN she has a UTI and BV, but is concerned because culture showed no UTI -- wants retest and to know what to take.  Culture on 12/31/19 showed mixed urogenital 50,000 to 100,000 colonies.  Wet prep on 12/31/19 was positive for BV and yeast.  She did complete Flagyl course, but has not taken Diflucan yet.  Did not take Penicillin for UTI.  Has history of full hysterectomy. Dysuria: no Urinary frequency: no Urgency: no Small volume voids: no Symptom severity: no Urinary incontinence: no Foul odor: no Hematuria: no Abdominal pain: mild initially for one day Back pain: no Suprapubic pain/pressure: no Flank pain: no Fever:  no Vomiting: no Relief with cranberry juice: none taken Relief with pyridium: none taken Status: stable Previous urinary tract infection: no Recurrent urinary tract infection: no Sexual activity: monogamous History of sexually transmitted disease: no Treatments attempted: antibiotics   Relevant past medical, surgical, family and social history reviewed and updated as indicated. Interim medical history since our last visit reviewed. Allergies and medications reviewed and updated.  Review of Systems  Constitutional: Negative for activity change, appetite change, diaphoresis, fatigue and fever.  Respiratory: Negative for cough, chest tightness and shortness of breath.   Cardiovascular:  Negative for chest pain, palpitations and leg swelling.  Psychiatric/Behavioral: Negative.     Per HPI unless specifically indicated above     Objective:    BP 123/82   Pulse 69   Temp 98.2 F (36.8 C)   Wt 276 lb 3.2 oz (125.3 kg)   LMP  (LMP Unknown)   SpO2 96%   BMI 47.41 kg/m   Wt Readings from Last 3 Encounters:  01/13/20 276 lb 3.2 oz (125.3 kg)  12/31/19 272 lb (123.4 kg)  07/15/19 300 lb (136.1 kg)    Physical Exam Vitals and nursing note reviewed.  Constitutional:      General: She is awake. She is not in acute distress.    Appearance: She is well-developed and well-groomed. She is morbidly obese. She is not ill-appearing.  HENT:     Head: Normocephalic.     Right Ear: Hearing normal.     Left Ear: Hearing normal.  Eyes:     General: Lids are normal.        Right eye: No discharge.        Left eye: No discharge.     Conjunctiva/sclera: Conjunctivae normal.     Pupils: Pupils are equal, round, and reactive to light.  Neck:     Vascular: No carotid bruit.  Cardiovascular:     Rate and Rhythm: Normal rate and regular rhythm.     Heart sounds: Normal heart sounds. No murmur heard.  No gallop.   Pulmonary:  Effort: Pulmonary effort is normal. No accessory muscle usage or respiratory distress.     Breath sounds: Normal breath sounds.  Abdominal:     General: Bowel sounds are normal.     Palpations: Abdomen is soft.  Musculoskeletal:     Cervical back: Normal range of motion and neck supple.     Right lower leg: No edema.     Left lower leg: No edema.  Skin:    General: Skin is warm and dry.  Neurological:     Mental Status: She is alert and oriented to person, place, and time.  Psychiatric:        Attention and Perception: Attention normal.        Mood and Affect: Mood normal.        Speech: Speech normal.        Behavior: Behavior normal. Behavior is cooperative.        Thought Content: Thought content normal.     Results for orders placed or  performed in visit on 12/31/19  Urine Culture   Specimen: Urine   UR  Result Value Ref Range   Urine Culture, Routine Final report    Organism ID, Bacteria Comment   Urine Culture   UR  Result Value Ref Range   Urine Culture, Routine Final report (A)    Organism ID, Bacteria Enterococcus faecalis (A)    Antimicrobial Susceptibility Comment   Hepatitis panel, acute  Result Value Ref Range   Hep A IgM Negative Negative   Hepatitis B Surface Ag Negative Negative   Hep B C IgM Negative Negative   Hep C Virus Ab <0.1 0.0 - 0.9 s/co ratio  HIV Antibody (routine testing w rflx)  Result Value Ref Range   HIV Screen 4th Generation wRfx Non Reactive Non Reactive  RPR Qual  Result Value Ref Range   RPR Ser Ql Non Reactive Non Reactive  POCT Urinalysis Dipstick  Result Value Ref Range   Color, UA     Clarity, UA     Glucose, UA Negative Negative   Bilirubin, UA NEGATIVE    Ketones, UA large    Spec Grav, UA >=1.030 (A) 1.010 - 1.025   Blood, UA positive    pH, UA 7.0 5.0 - 8.0   Protein, UA Negative Negative   Urobilinogen, UA 1.0 0.2 or 1.0 E.U./dL   Nitrite, UA negative    Leukocytes, UA Moderate (2+) (A) Negative   Appearance     Odor    Cervicovaginal ancillary only  Result Value Ref Range   Neisseria Gonorrhea Negative    Chlamydia Negative    Trichomonas Negative    Bacterial Vaginitis (gardnerella) Positive (A)    Candida Vaginitis Positive (A)    Candida Glabrata Positive (A)    Comment Normal Reference Range Candida Species - Negative    Comment Normal Reference Range Candida Galbrata - Negative    Comment Normal Reference Range Trichomonas - Negative    Comment Normal Reference Ranger Chlamydia - Negative    Comment      Normal Reference Range Neisseria Gonorrhea - Negative   Comment      Normal Reference Range Bacterial Vaginosis - Negative      Assessment & Plan:   Problem List Items Addressed This Visit      Other   Vaginal discharge - Primary     Recently treated with Flagyl for BV and just completed course, has not taken Diflucan yet.  Wet prep today +  clue cells and + yeast.  UA 2+ BLD (no menstrual cycles, suspect irritation from recent infections and intercourse), neg nitrites.  Recommend she take Diflucan as instructed.  Will hold off on further BV treated, as just completed this.  Not to take Penicillin at this time, will send urine for culture first and if growth present will treat.  Recommend increased water intake.  If worsening or ongoing symptoms then return to office.      Relevant Orders   UA/M w/rflx Culture, Routine   WET PREP FOR TRICH, YEAST, CLUE       Follow up plan: Return if symptoms worsen or fail to improve.

## 2020-01-13 NOTE — Assessment & Plan Note (Signed)
Recently treated with Flagyl for BV and just completed course, has not taken Diflucan yet.  Wet prep today + clue cells and + yeast.  UA 2+ BLD (no menstrual cycles, suspect irritation from recent infections and intercourse), neg nitrites.  Recommend she take Diflucan as instructed.  Will hold off on further BV treated, as just completed this.  Not to take Penicillin at this time, will send urine for culture first and if growth present will treat.  Recommend increased water intake.  If worsening or ongoing symptoms then return to office.

## 2020-01-13 NOTE — Patient Instructions (Signed)

## 2020-01-21 ENCOUNTER — Encounter: Payer: Self-pay | Admitting: Obstetrics and Gynecology

## 2020-03-07 ENCOUNTER — Ambulatory Visit: Payer: Self-pay

## 2020-03-07 NOTE — Telephone Encounter (Signed)
   RS    2        Auri M. Wagenaar Female, 50 y.o., 28-Nov-1970  MRN:  [id number] Phone:  [phone] Judie Petit)       PCP:  Particia Nearing, PA-C Primary Cvg:  None  Next Appt With Family Medicine 05/12/2020 at 10:00 AM         Message from Lorayne Bender sent at 03/07/2020 9:52 AM EST  Summary: COVID exposure   Pt was exposed to someone with COVID on New Year's Eve and would like to know what quarantine protocols are for her. Patient and COVID + person were not masked at all times. Patient attempted to schedule testing for today but no openings - was given phone number to [facility] Department to see if they have testing available.          Call History   Type Contact Phone/Fax User  03/07/2020 09:45 AM EST Phone (Incoming) Teodora, [place]umgarten (Self) [phone] Rexene Edison) Alexander, Amber L   Pt. Had a known COVID 19 exposure on New Year's Eve. Feels fatigued, sinus symptoms. Will try and get tested at her pharmacy. Declines a virtual visit at this time. Will call back as needed. Answer Assessment - Initial Assessment Questions 1. COVID-19 EXPOSURE: "Please describe how you were exposed to someone with a COVID-19 infection."     Yes 2. PLACE of CONTACT: "Where were you when you were exposed to COVID-19?" (e.g., home, school, medical waiting room; which city?)     Home 3. TYPE of CONTACT: "How much contact was there?" (e.g., sitting next to, live in same house, work in same office, same building)     Party 4. DURATION of CONTACT: "How long were you in contact with the COVID-19 patient?" (e.g., a few seconds, passed by person, a few minutes, 15 minutes or longer, live with the patient)     Several hours 5. MASK: "Were you wearing a mask?" "Was the other person wearing a mask?" Note: wearing a mask reduces the risk of an otherwise close contact.     No 6. DATE of CONTACT: "When did you have contact with a COVID-19 patient?" (e.g., how many days  ago)     04/05/19 7. COMMUNITY SPREAD: "Are there lots of cases of COVID-19 (community spread) where you live?" (See public health department website, if unsure)       Yes 8. SYMPTOMS: "Do you have any symptoms?" (e.g., fever, cough, breathing difficulty, loss of taste or smell)     Fatigue 9. VACCINE: "Have you gotten the COVID-19 vaccine?" If Yes ask: "Which one, how many shots, when did you get it?"     No 10. PREGNANCY OR POSTPARTUM: "Is there any chance you are pregnant?" "When was your last menstrual period?" "Did you deliver in the last 2 weeks?"       No 11. HIGH RISK: "Do you have any heart or lung problems?" "Do you have a weak immune system?" (e.g., heart failure, COPD, asthma, HIV positive, chemotherapy, renal failure, diabetes mellitus, sickle cell anemia, obesity)       No 12. TRAVEL: "Have you traveled out of the country recently?" If Yes, ask: "When and where?" Also ask about out-of-state travel, since the CDC has identified some high-risk cities for community spread in the [place]. Note: Travel becomes less relevant if there is widespread community transmission where the patient lives.       No  Protocols used: CORONAVIRUS (COVID-19) EXPOSURE-A-AH

## 2020-03-09 ENCOUNTER — Ambulatory Visit: Payer: Self-pay | Admitting: *Deleted

## 2020-03-09 NOTE — Telephone Encounter (Signed)
Patient exposed to Covid 5 days ago. Symptoms began 3 days ago-seen at [facility] and tested negative for Covid. Dx with URI/sinus infection. Called UC today-antibiotic called in for her. She will begin it tonight.Denies SOB/fever.  Has virtual appointment tomorrow. Care advice including mucinex OTC with increased daily water intake, NS nasal spray several times/day, cough syrup OTC at night.Any changes/concerns with your breathing seek immediate evaluation.   Answer Assessment - Initial Assessment Questions 1. COVID-19 EXPOSURE: "Please describe how you were exposed to someone with a COVID-19 infection."     Around someone with covid 5 days ago.  2. PLACE of CONTACT: "Where were you when you were exposed to COVID-19?" (e.g., home, school, medical waiting room; which city?)    gathering 3. TYPE of CONTACT: "How much contact was there?" (e.g., sitting next to, live in same house, work in same office, same building)     4. DURATION of CONTACT: "How long were you in contact with the COVID-19 patient?" (e.g., a few seconds, passed by person, a few minutes, 15 minutes or longer, live with the patient)     Several hours 5. MASK: "Were you wearing a mask?" "Was the other person wearing a mask?" Note: wearing a mask reduces the risk of an otherwise close contact.     no 6. DATE of CONTACT: "When did you have contact with a COVID-19 patient?" (e.g., how many days ago)     5 days ago 7. COMMUNITY SPREAD: "Are there lots of cases of COVID-19 (community spread) where you live?" (See public health department website, if unsure)       yes 8. SYMPTOMS: "Do you have any symptoms?" (e.g., fever, cough, breathing difficulty, loss of taste or smell)     Congestion cough fatigue sneezing 9. VACCINE: "Have you gotten the COVID-19 vaccine?" If Yes ask: "Which one, how many shots, when did you get it?"      10. PREGNANCY OR POSTPARTUM: "Is there any chance you are pregnant?" "When was your last menstrual period?" "Did you  deliver in the last 2 weeks?"       no 11. HIGH RISK: "Do you have any heart or lung problems?" "Do you have a weak immune system?" (e.g., heart failure, COPD, asthma, HIV positive, chemotherapy, renal failure, diabetes mellitus, sickle cell anemia, obesity)       no 12. TRAVEL: "Have you traveled out of the country recently?" If Yes, ask: "When and where?" Also ask about out-of-state travel, since the CDC has identified some high-risk cities for community spread in the [place]. Note: Travel becomes less relevant if there is widespread community transmission where the patient lives.      Not relevant  Protocols used: CORONAVIRUS (COVID-19) EXPOSURE-A-AH

## 2020-03-10 ENCOUNTER — Encounter: Payer: Self-pay | Admitting: Unknown Physician Specialty

## 2020-03-10 ENCOUNTER — Telehealth (INDEPENDENT_AMBULATORY_CARE_PROVIDER_SITE_OTHER): Payer: Self-pay | Admitting: Unknown Physician Specialty

## 2020-03-10 DIAGNOSIS — B379 Candidiasis, unspecified: Secondary | ICD-10-CM

## 2020-03-10 DIAGNOSIS — J069 Acute upper respiratory infection, unspecified: Secondary | ICD-10-CM

## 2020-03-10 DIAGNOSIS — J029 Acute pharyngitis, unspecified: Secondary | ICD-10-CM

## 2020-03-10 MED ORDER — FLUCONAZOLE 150 MG PO TABS: 150.0000 mg | ORAL_TABLET | Freq: Once | ORAL | 0 refills | Status: AC

## 2020-03-10 NOTE — Progress Notes (Signed)
LMP  (LMP Unknown)    Subjective:    Patient ID: Jasmine Tucker, female    DOB: 1970-07-04, 50 y.o.   MRN: [id number]  HPI: Jasmine Tucker is a 50 y.o. female  Chief Complaint  Patient presents with  . Covid Exposure    Was exposed to Covid on New Years Eve, have been having congestion, sore throat, razor sharp sore throat, head congestion, ear pain, runny nose cough, symptoms were getting worse but started to get better yesterday, Tested on Monday came back negative, went to UC and was told she had a sinus infection given ABX and starting to feel better, but throat is still sore, hard to ear. Went again today for a Covid test and waiting on results.   This visit was completed via telephone due to the restrictions of the COVID-19 pandemic. All issues as above were discussed and addressed but no physical exam was performed. If it was felt that the patient should be evaluated in the office, they were directed there. The patient verbally consented to this visit. Patient was unable to complete an audio/visual visit due to Technical difficulties,Lack of internet. . Location of the patient: home . Location of the provider: work . Those involved with this call:  . Provider: Gabriel Cirri, DNP . CMA: Wilhemena Durie, CMA . Front Desk/Registration: Harriet Pho  . Time spent on call: 20 minutes on the phone discussing health concerns. 10 minutes total spent in review of patient's record and preparation of their chart.  I verified patient identity using two factors (patient name and date of birth). Patient consents verbally to being seen via telemedicine visit today.   URI  This is a new (Went to urgent care tested negative.  Called back and got an antibiotic  -Amoxi.) problem. Episode onset: sx onset 1/2. Progression since onset: slightly better. There has been no fever. Associated symptoms include congestion, coughing, headaches, rhinorrhea, sinus pain, sneezing, a sore throat and  swollen glands. Pertinent negatives include no abdominal pain, chest pain, diarrhea, dysuria, ear pain, joint pain, joint swelling, nausea, neck pain, plugged ear sensation, rash, vomiting or wheezing.   Pt has a second covid test pending at [facility].  Was exposed by her sister with Covid.  Her most bothersome symptom is a sore throat.    Relevant past medical, surgical, family and social history reviewed and updated as indicated. Interim medical history since our last visit reviewed. Allergies and medications reviewed and updated.  Review of Systems  HENT: Positive for congestion, rhinorrhea, sinus pain, sneezing and sore throat. Negative for ear pain.   Respiratory: Positive for cough. Negative for wheezing.   Cardiovascular: Negative for chest pain.  Gastrointestinal: Negative for abdominal pain, diarrhea, nausea and vomiting.  Genitourinary: Negative for dysuria.  Musculoskeletal: Negative for joint pain and neck pain.  Skin: Negative for rash.  Neurological: Positive for headaches.    Per HPI unless specifically indicated above     Objective:    LMP  (LMP Unknown)   Wt Readings from Last 3 Encounters:  01/13/20 276 lb 3.2 oz (125.3 kg)  12/31/19 272 lb (123.4 kg)  07/15/19 300 lb (136.1 kg)    Physical Exam Skin:    General: Skin is warm.  Neurological:     Mental Status: She is alert.  Psychiatric:        Mood and Affect: Mood normal.        Thought Content: Thought content normal.  Assessment & Plan:   Problem List Items Addressed This Visit   None   Visit Diagnoses    Viral upper respiratory tract infection    -  Primary   I suspect Covid. Since 4 days of illness appropriate for oral tx but refused as she didn't want labs.  Supportive care of afrin, pseudophed,tyl, Ibuprofen   Relevant Medications   fluconazole (DIFLUCAN) 150 MG tablet   Sore throat       Salt water gargles and honey.  Supportive care   Yeast infection       Will rx Diflucan  as on antibiotics.  Discussed that antibiotics will probably not be effective for her condtion   Relevant Medications   fluconazole (DIFLUCAN) 150 MG tablet        Follow up plan: Return if symptoms worsen or fail to improve.

## 2020-03-29 ENCOUNTER — Encounter: Payer: Self-pay | Admitting: Unknown Physician Specialty

## 2020-03-29 ENCOUNTER — Other Ambulatory Visit: Payer: Self-pay | Admitting: Unknown Physician Specialty

## 2020-05-01 ENCOUNTER — Encounter: Payer: Self-pay | Admitting: Family Medicine

## 2020-05-01 ENCOUNTER — Other Ambulatory Visit: Payer: Self-pay | Admitting: Family Medicine

## 2020-05-01 DIAGNOSIS — F418 Other specified anxiety disorders: Secondary | ICD-10-CM

## 2020-05-02 ENCOUNTER — Other Ambulatory Visit: Payer: Self-pay

## 2020-05-02 DIAGNOSIS — F418 Other specified anxiety disorders: Secondary | ICD-10-CM

## 2020-05-02 NOTE — Telephone Encounter (Signed)
Patient has sent in a message asking to have her Lorazepam 0.5mg  20tab.

## 2020-05-05 NOTE — Telephone Encounter (Signed)
Patient called in need to schedule an appointment Please contact her at Ph# [phone]

## 2020-05-12 ENCOUNTER — Other Ambulatory Visit: Payer: Self-pay

## 2020-05-12 ENCOUNTER — Ambulatory Visit (INDEPENDENT_AMBULATORY_CARE_PROVIDER_SITE_OTHER): Payer: BC Managed Care – PPO | Admitting: Family Medicine

## 2020-05-12 ENCOUNTER — Encounter: Payer: Self-pay | Admitting: Family Medicine

## 2020-05-12 VITALS — BP 119/82 | HR 66 | Temp 98.2°F | Ht 63.0 in | Wt 272.2 lb

## 2020-05-12 DIAGNOSIS — L732 Hidradenitis suppurativa: Secondary | ICD-10-CM

## 2020-05-12 DIAGNOSIS — E559 Vitamin D deficiency, unspecified: Secondary | ICD-10-CM | POA: Diagnosis not present

## 2020-05-12 DIAGNOSIS — Z124 Encounter for screening for malignant neoplasm of cervix: Secondary | ICD-10-CM

## 2020-05-12 DIAGNOSIS — N644 Mastodynia: Secondary | ICD-10-CM

## 2020-05-12 DIAGNOSIS — Z1283 Encounter for screening for malignant neoplasm of skin: Secondary | ICD-10-CM

## 2020-05-12 DIAGNOSIS — Z23 Encounter for immunization: Secondary | ICD-10-CM

## 2020-05-12 DIAGNOSIS — F418 Other specified anxiety disorders: Secondary | ICD-10-CM

## 2020-05-12 DIAGNOSIS — Z113 Encounter for screening for infections with a predominantly sexual mode of transmission: Secondary | ICD-10-CM

## 2020-05-12 DIAGNOSIS — F419 Anxiety disorder, unspecified: Secondary | ICD-10-CM

## 2020-05-12 DIAGNOSIS — H538 Other visual disturbances: Secondary | ICD-10-CM

## 2020-05-12 DIAGNOSIS — Z0000 Encounter for general adult medical examination without abnormal findings: Secondary | ICD-10-CM | POA: Diagnosis not present

## 2020-05-12 DIAGNOSIS — Z1211 Encounter for screening for malignant neoplasm of colon: Secondary | ICD-10-CM

## 2020-05-12 LAB — MICROSCOPIC EXAMINATION: WBC, UA: NONE SEEN /hpf (ref 0–5)

## 2020-05-12 LAB — URINALYSIS, ROUTINE W REFLEX MICROSCOPIC
Bilirubin, UA: NEGATIVE
Glucose, UA: NEGATIVE
Ketones, UA: NEGATIVE
Leukocytes,UA: NEGATIVE
Nitrite, UA: NEGATIVE
Specific Gravity, UA: 1.02 (ref 1.005–1.030)
Urobilinogen, Ur: 4 mg/dL — ABNORMAL HIGH (ref 0.2–1.0)
pH, UA: 7.5 (ref 5.0–7.5)

## 2020-05-12 MED ORDER — BUSPIRONE HCL 15 MG PO TABS: 15.0000 mg | ORAL_TABLET | Freq: Two times a day (BID) | ORAL | 1 refills | Status: AC

## 2020-05-12 MED ORDER — LORAZEPAM 0.5 MG PO TABS: ORAL_TABLET | ORAL | 0 refills | Status: DC

## 2020-05-12 MED ORDER — MELOXICAM 15 MG PO TABS: ORAL_TABLET | ORAL | 1 refills | Status: DC

## 2020-05-12 MED ORDER — DICLOFENAC SODIUM 1 % EX GEL: 4.0000 g | Freq: Four times a day (QID) | CUTANEOUS | 12 refills | Status: AC

## 2020-05-12 NOTE — Assessment & Plan Note (Signed)
Would like to see dermatology. Referral generated today.

## 2020-05-12 NOTE — Assessment & Plan Note (Signed)
Under good control on current regimen. Continue current regimen. Continue to monitor. Call with any concerns. Refills given. 20 pills of lorazepam should last at least 6 months. Follow up 6 months. If needing meds daily- call and we will start preventative medicine.

## 2020-05-12 NOTE — Progress Notes (Signed)
BP 119/82   Pulse 66   Temp 98.2 F (36.8 C)   Ht _0  (1.6 m)   Wt 272 lb 3.2 oz (123.5 kg)   LMP  (LMP Unknown)   SpO2 98%   BMI 48.22 kg/m    Subjective:    Patient ID: Jasmine Tucker, female    DOB: 1970/07/26, 50 y.o.   MRN: [id number]  HPI: Jasmine Tucker is a 50 y.o. female presenting on 05/12/2020 for comprehensive medical examination. Current medical complaints include:  BREAST PAIN Duration :6 months Location: L breast, 3-4:00 Onset: gradual Severity: moderate Quality: dull Frequency: intermittent Redness: no Swelling: no Trauma: no trauma Breastfeeding: no Associated with menstral cycle: no Nipple discharge: no Breast lump: no Status: stable Treatments attempted: none Previous mammogram: yes  ANXIETY/STRESS- taking the buspar about 2x a week, taking the lorazepam about 1x every 2 weeks Duration: chronic Status:stable Anxious mood: yes  Excessive worrying: yes Irritability: yes  Sweating: yes Nausea: no Palpitations:no Hyperventilation: no Panic attacks: yes Agoraphobia: no  Obscessions/compulsions: no Depressed mood: no Depression screen [facility] 2/9 05/12/2020 03/10/2020 11/04/2019 07/15/2019 05/09/2018  Decreased Interest 1 0 _1 Down, Depressed, Hopeless 0 0 _2 PHQ - 2 Score 1 0 _3 Altered sleeping 3 - _4 Tired, decreased energy 1 - 0 1 2  Change in appetite 1 - 1 0 0  Feeling bad or failure about yourself  0 - 0 1 0  Trouble concentrating 1 - 0 2 0  Moving slowly or fidgety/restless 0 - 0 0 0  Suicidal thoughts 0 - 0 0 0  PHQ-9 Score 7 - _5 Difficult doing work/chores Somewhat difficult - - Very difficult -   Anhedonia: no Weight changes: no Insomnia: no   Hypersomnia: no Fatigue/loss of energy: yes Feelings of worthlessness: yes Feelings of guilt: no Impaired concentration/indecisiveness: no Suicidal ideations: no  Crying spells: no Recent Stressors/Life Changes: yes   Relationship problems: no   Family stress:  no     Financial stress: no    Job stress: no    Recent death/loss: no  Menopausal Symptoms: no  Depression Screen done today and results listed below:  Depression screen [facility] 2/9 05/12/2020 03/10/2020 11/04/2019 07/15/2019 05/09/2018  Decreased Interest 1 0 _6 Down, Depressed, Hopeless 0 0 _7 PHQ - 2 Score 1 0 _8 Altered sleeping 3 - _9 Tired, decreased energy 1 - 0 1 2  Change in appetite 1 - 1 0 0  Feeling bad or failure about yourself  0 - 0 1 0  Trouble concentrating 1 - 0 2 0  Moving slowly or fidgety/restless 0 - 0 0 0  Suicidal thoughts 0 - 0 0 0  PHQ-9 Score 7 - _10 Difficult doing work/chores Somewhat difficult - - Very difficult -    Past Medical History:  Past Medical History:  Diagnosis Date  . Anemia   . Anxiety   . BRCA negative 2016   21 panel test done at [place]  . Family history of breast cancer   . Family history of ovarian cancer   . GERD (gastroesophageal reflux disease)   . Menorrhagia   . Plantar fasciitis 2017  . Thrombocytosis     Surgical History:  Past Surgical History:  Procedure Laterality Date  . ABDOMINAL HYSTERECTOMY    .  BREAST SURGERY     breast biopsy 4 months ago, left breast  . CHOLECYSTECTOMY    . GASTRIC BYPASS  2000  . LAPAROSCOPIC BILATERAL SALPINGECTOMY Bilateral 07/20/2014   Procedure: LAPAROSCOPIC BILATERAL SALPINGECTOMY;  Surgeon: Gae Dry, MD;  Location: [facility] ORS;  Service: Gynecology;  Laterality: Bilateral;  . LAPAROSCOPIC HYSTERECTOMY N/A 07/20/2014   Procedure: HYSTERECTOMY TOTAL LAPAROSCOPIC;  Surgeon: Gae Dry, MD;  Location: [facility] ORS;  Service: Gynecology;  Laterality: N/A;  . LAPAROSCOPIC OOPHERECTOMY Bilateral 07/20/2014   Procedure: LAPAROSCOPIC OOPHERECTOMY-(POSSIBLE);  Surgeon: Gae Dry, MD;  Location: [facility] ORS;  Service: Gynecology;  Laterality: Bilateral;    Medications:  Current Outpatient Medications on File Prior to Visit  Medication Sig  . fluticasone  (FLONASE) 50 MCG/ACT nasal spray Place 2 sprays into both nostrils daily.   No current facility-administered medications on file prior to visit.    Allergies:  Allergies  Allergen Reactions  . Latex Itching and Rash    Social History:  Social History   Socioeconomic History  . Marital status: Married    Spouse name: Jarita Raval  . Number of children: 1  . Years of education: Not on file  . Highest education level: Master's degree (e.g., MA, MS, MEng, MEd, MSW, MBA)  Occupational History  . Occupation: [employer]  Tobacco Use  . Smoking status: Never Smoker  . Smokeless tobacco: Never Used  Vaping Use  . Vaping Use: Never used  Substance and Sexual Activity  . Alcohol use: Yes    Comment: socially  . Drug use: No  . Sexual activity: Not Currently  Other Topics Concern  . Not on file  Social History Narrative  . Not on file   Social Determinants of Health   Financial Resource Strain: Not on file  Food Insecurity: Not on file  Transportation Needs: Not on file  Physical Activity: Not on file  Stress: Not on file  Social Connections: Not on file  Intimate Partner Violence: Not on file   Social History   Tobacco Use  Smoking Status Never Smoker  Smokeless Tobacco Never Used   Social History   Substance and Sexual Activity  Alcohol Use Yes   Comment: socially    Family History:  Family History  Problem Relation Age of Onset  . Ovarian cancer Mother 32  . Breast cancer Maternal Aunt 65  . Cancer Paternal Aunt        either cervical or ovarian cancer  . Cervical cancer Maternal Aunt        reportedly the cause of death was cervical cancer  . Lung cancer Paternal Aunt   . Breast cancer Cousin   . Breast cancer Cousin     Past medical history, surgical history, medications, allergies, family history and social history reviewed with patient today and changes made to appropriate areas of the chart.   Review of Systems  Constitutional: Positive  for diaphoresis. Negative for chills, fever, malaise/fatigue and weight loss.  HENT: Negative.   Eyes: Positive for blurred vision. Negative for double vision, photophobia, pain, discharge and redness.  Respiratory: Negative.   Cardiovascular: Positive for palpitations. Negative for chest pain, orthopnea, claudication, leg swelling and PND.  Gastrointestinal: Positive for heartburn. Negative for abdominal pain, blood in stool, constipation, diarrhea, melena, nausea and vomiting.  Genitourinary: Positive for frequency. Negative for dysuria, flank pain, hematuria and urgency.  Musculoskeletal: Positive for myalgias. Negative for back pain, falls, joint pain and neck pain.  Skin: Negative.  Neurological: Negative.   Endo/Heme/Allergies: Positive for environmental allergies. Negative for polydipsia. Bruises/bleeds easily.  Psychiatric/Behavioral: Negative for depression, hallucinations, memory loss, substance abuse and suicidal ideas. The patient is nervous/anxious. The patient does not have insomnia.    All other ROS negative except what is listed above and in the HPI.      Objective:    BP 119/82   Pulse 66   Temp 98.2 F (36.8 C)   Ht _0  (1.6 m)   Wt 272 lb 3.2 oz (123.5 kg)   LMP  (LMP Unknown)   SpO2 98%   BMI 48.22 kg/m   Wt Readings from Last 3 Encounters:  05/12/20 272 lb 3.2 oz (123.5 kg)  01/13/20 276 lb 3.2 oz (125.3 kg)  12/31/19 272 lb (123.4 kg)    Physical Exam Vitals and nursing note reviewed.  Constitutional:      General: She is not in acute distress.    Appearance: Normal appearance. She is obese. She is not ill-appearing, toxic-appearing or diaphoretic.  HENT:     Head: Normocephalic and atraumatic.     Right Ear: Tympanic membrane, ear canal and external ear normal. There is no impacted cerumen.     Left Ear: Tympanic membrane, ear canal and external ear normal. There is no impacted cerumen.     Nose: Nose normal. No congestion or rhinorrhea.      Mouth/Throat:     Mouth: Mucous membranes are moist.     Pharynx: Oropharynx is clear. No oropharyngeal exudate or posterior oropharyngeal erythema.  Eyes:     General: No scleral icterus.       Right eye: No discharge.        Left eye: No discharge.     Extraocular Movements: Extraocular movements intact.     Conjunctiva/sclera: Conjunctivae normal.     Pupils: Pupils are equal, round, and reactive to light.  Neck:     Vascular: No carotid bruit.  Cardiovascular:     Rate and Rhythm: Normal rate and regular rhythm.     Pulses: Normal pulses.     Heart sounds: No murmur heard. No friction rub. No gallop.   Pulmonary:     Effort: Pulmonary effort is normal. No respiratory distress.     Breath sounds: Normal breath sounds. No stridor. No wheezing, rhonchi or rales.  Chest:     Chest wall: No tenderness.  Abdominal:     General: Abdomen is flat. Bowel sounds are normal. There is no distension.     Palpations: Abdomen is soft. There is no mass.     Tenderness: There is no abdominal tenderness. There is no right CVA tenderness, left CVA tenderness, guarding or rebound.     Hernia: No hernia is present.  Genitourinary:    Comments: Breast and pelvic exams deferred with shared decision making Musculoskeletal:        General: No swelling, tenderness, deformity or signs of injury.     Cervical back: Normal range of motion and neck supple. No rigidity. No muscular tenderness.     Right lower leg: No edema.     Left lower leg: No edema.  Lymphadenopathy:     Cervical: No cervical adenopathy.  Skin:    General: Skin is warm and dry.     Capillary Refill: Capillary refill takes less than 2 seconds.     Coloration: Skin is not jaundiced or pale.     Findings: No bruising, erythema, lesion or rash.  Neurological:  General: No focal deficit present.     Mental Status: She is alert and oriented to person, place, and time. Mental status is at baseline.     Cranial Nerves: No cranial  nerve deficit.     Sensory: No sensory deficit.     Motor: No weakness.     Coordination: Coordination normal.     Gait: Gait normal.     Deep Tendon Reflexes: Reflexes normal.  Psychiatric:        Mood and Affect: Mood normal.        Behavior: Behavior normal.        Thought Content: Thought content normal.        Judgment: Judgment normal.     Results for orders placed or performed in visit on 01/13/20  WET PREP FOR [place], YEAST, CLUE   Specimen: Vaginal; Sterile Swab   Sterile Swab  Result Value Ref Range   Trichomonas Exam Negative Negative   Yeast Exam Positive (A) Negative   Clue Cell Exam Positive (A) Negative  Microscopic Examination   Urine  Result Value Ref Range   WBC, UA 0-5 0 - 5 /hpf   RBC 3-10 (A) 0 - 2 /hpf   Epithelial Cells (non renal) 0-10 0 - 10 /hpf   Bacteria, UA None seen None seen/Few   Yeast, UA Present None seen  UA/M w/rflx Culture, Routine   Specimen: Urine   Urine  Result Value Ref Range   Specific Gravity, UA >1.030 (H) 1.005 - 1.030   pH, UA 6.0 5.0 - 7.5   Color, UA Yellow Yellow   Appearance Ur Clear Clear   Leukocytes,UA Negative Negative   Protein,UA 1+ (A) Negative/Trace   Glucose, UA Negative Negative   Ketones, UA 1+ (A) Negative   RBC, UA 2+ (A) Negative   Bilirubin, UA Negative Negative   Urobilinogen, Ur >8.0 (H) 0.2 - 1.0 mg/dL   Nitrite, UA Negative Negative   Microscopic Examination See below:       Assessment & Plan:   Problem List Items Addressed This Visit      Musculoskeletal and Integument   Hidradenitis suppurativa    Would like to see dermatology. Referral generated today.      Relevant Orders   Ambulatory referral to Dermatology     Other   Chronic anxiety    Under good control on current regimen. Continue current regimen. Continue to monitor. Call with any concerns. Refills given. 20 pills of lorazepam should last at least 6 months. Follow up 6 months. If needing meds daily- call and we will start  preventative medicine.        Relevant Medications   busPIRone (BUSPAR) 15 MG tablet   LORazepam (ATIVAN) 0.5 MG tablet   Vitamin D deficiency    Rechecking labs today. Await results. Call with any concerns.       Relevant Orders   VITAMIN D 25 Hydroxy (Vit-D Deficiency, Fractures)    Other Visit Diagnoses    Routine general medical examination at a health care facility    -  Primary   Vaccines up to date. Screening labs checked today. Pap through GYN. Mammogram and cologuard ordered. Continue to monitor. Call with any concerns.    Relevant Orders   CBC with Differential/Platelet   Comprehensive metabolic panel   Lipid Panel w/o Chol/HDL Ratio   Urinalysis, Routine w reflex microscopic   TSH   Breast pain       Will get her set up  for mammogram. Ordered today. Await results.    Relevant Orders   MM Digital Diagnostic Bilat   US BREAST LTD UNI LEFT INC AXILLA   Routine screening for STI (sexually transmitted infection)       Labs drawn today. Await results.    Relevant Orders   HIV Antibody (routine testing w rflx)   GC/Chlamydia Probe Amp   Hepatitis panel, acute   RPR   HSV(herpes simplex vrs) 1+2 ab-IgG   Screening for skin cancer       Would like to see dermatology. Referral generated today.   Relevant Orders   Ambulatory referral to Dermatology   Blurred vision       Would like to see opthalmology. Referral generated today.   Relevant Orders   Ambulatory referral to Ophthalmology   Screening for cervical cancer       Would like to see gynecology. Referral generated today.   Relevant Orders   Ambulatory referral to Obstetrics / Gynecology   Screening for colon cancer       Cologuard ordered today.   Relevant Orders   Cologuard   Situational anxiety       Relevant Medications   busPIRone (BUSPAR) 15 MG tablet   LORazepam (ATIVAN) 0.5 MG tablet       Follow up plan: Return in about 6 months (around 11/12/2020) for follow up with PCP.   LABORATORY  TESTING:  - Pap smear: done elsewhere  IMMUNIZATIONS:   - Tdap: Tetanus vaccination status reviewed: Tdap vaccination indicated and given today. - Influenza: Refused - Pneumovax: Not applicable - Prevnar: Not applicable - COVID: Up to date  SCREENING: -Mammogram: Ordered today  - Colonoscopy: Cologuard ordered today   PATIENT COUNSELING:   Advised to take 1 mg of folate supplement per day if capable of pregnancy.   Sexuality: Discussed sexually transmitted diseases, partner selection, use of condoms, avoidance of unintended pregnancy  and contraceptive alternatives.   Advised to avoid cigarette smoking.  I discussed with the patient that most people either abstain from alcohol or drink within safe limits (<=14/week and <=4 drinks/occasion for males, <=7/weeks and <= 3 drinks/occasion for females) and that the risk for alcohol disorders and other health effects rises proportionally with the number of drinks per week and how often a drinker exceeds daily limits.  Discussed cessation/primary prevention of drug use and availability of treatment for abuse.   Diet: Encouraged to adjust caloric intake to maintain  or achieve ideal body weight, to reduce intake of dietary saturated fat and total fat, to limit sodium intake by avoiding high sodium foods and not adding table salt, and to maintain adequate dietary potassium and calcium preferably from fresh fruits, vegetables, and low-fat dairy products.    stressed the importance of regular exercise  Injury prevention: Discussed safety belts, safety helmets, smoke detector, smoking near bedding or upholstery.   Dental health: Discussed importance of regular tooth brushing, flossing, and dental visits.    NEXT PREVENTATIVE PHYSICAL DUE IN 1 YEAR. Return in about 6 months (around 11/12/2020) for follow up with PCP.

## 2020-05-12 NOTE — Patient Instructions (Addendum)
Call to schedule your mammogram:  [facility] at [facility]  Address: [address], [place], [place] [address]  Phone: [phone]   Health Maintenance, Female Adopting a healthy lifestyle and getting preventive care are important in promoting health and wellness. Ask your health care provider about:  The right schedule for you to have regular tests and exams.  Things you can do on your own to prevent diseases and keep yourself healthy. What should I know about diet, weight, and exercise? Eat a healthy diet  Eat a diet that includes plenty of vegetables, fruits, low-fat dairy products, and lean protein.  Do not eat a lot of foods that are high in solid fats, added sugars, or sodium.   Maintain a healthy weight Body mass index (BMI) is used to identify weight problems. It estimates body fat based on height and weight. Your health care provider can help determine your BMI and help you achieve or maintain a healthy weight. Get regular exercise Get regular exercise. This is one of the most important things you can do for your health. Most adults should:  Exercise for at least 150 minutes each week. The exercise should increase your heart rate and make you sweat (moderate-intensity exercise).  Do strengthening exercises at least twice a week. This is in addition to the moderate-intensity exercise.  Spend less time sitting. Even light physical activity can be beneficial. Watch cholesterol and blood lipids Have your blood tested for lipids and cholesterol at 52 years of age, then have this test every 5 years. Have your cholesterol levels checked more often if:  Your lipid or cholesterol levels are high.  You are older than 52 years of age.  You are at high risk for heart disease. What should I know about cancer screening? Depending on your health history and family history, you may need to have cancer screening at various ages. This may include screening  for:  Breast cancer.  Cervical cancer.  Colorectal cancer.  Skin cancer.  Lung cancer. What should I know about heart disease, diabetes, and high blood pressure? Blood pressure and heart disease  High blood pressure causes heart disease and increases the risk of stroke. This is more likely to develop in people who have high blood pressure readings, are of African descent, or are overweight.  Have your blood pressure checked: ? Every 3-5 years if you are 22-7 years of age. ? Every year if you are 33 years old or older. Diabetes Have regular diabetes screenings. This checks your fasting blood sugar level. Have the screening done:  Once every three years after age 79 if you are at a normal weight and have a low risk for diabetes.  More often and at a younger age if you are overweight or have a high risk for diabetes. What should I know about preventing infection? Hepatitis B If you have a higher risk for hepatitis B, you should be screened for this virus. Talk with your health care provider to find out if you are at risk for hepatitis B infection. Hepatitis C Testing is recommended for:  Everyone born from [birth year] through 1967.  Anyone with known risk factors for hepatitis C. Sexually transmitted infections (STIs)  Get screened for STIs, including gonorrhea and chlamydia, if: ? You are sexually active and are younger than 52 years of age. ? You are older than 52 years of age and your health care provider tells you that you are at risk for this type  of infection. ? Your sexual activity has changed since you were last screened, and you are at increased risk for chlamydia or gonorrhea. Ask your health care provider if you are at risk.  Ask your health care provider about whether you are at high risk for HIV. Your health care provider may recommend a prescription medicine to help prevent HIV infection. If you choose to take medicine to prevent HIV, you should first get tested for HIV.  You should then be tested every 3 months for as long as you are taking the medicine. Pregnancy  If you are about to stop having your period (premenopausal) and you may become pregnant, seek counseling before you get pregnant.  Take 400 to 800 micrograms (mcg) of folic acid every day if you become pregnant.  Ask for birth control (contraception) if you want to prevent pregnancy. Osteoporosis and menopause Osteoporosis is a disease in which the bones lose minerals and strength with aging. This can result in bone fractures. If you are 22 years old or older, or if you are at risk for osteoporosis and fractures, ask your health care provider if you should:  Be screened for bone loss.  Take a calcium or vitamin D supplement to lower your risk of fractures.  Be given hormone replacement therapy (HRT) to treat symptoms of menopause. Follow these instructions at home: Lifestyle  Do not use any products that contain nicotine or tobacco, such as cigarettes, e-cigarettes, and chewing tobacco. If you need help quitting, ask your health care provider.  Do not use street drugs.  Do not share needles.  Ask your health care provider for help if you need support or information about quitting drugs. Alcohol use  Do not drink alcohol if: ? Your health care provider tells you not to drink. ? You are pregnant, may be pregnant, or are planning to become pregnant.  If you drink alcohol: ? Limit how much you use to 0-1 drink a day. ? Limit intake if you are breastfeeding.  Be aware of how much alcohol is in your drink. In the [place] one drink equals one 12 oz bottle of beer (355 mL), one 5 oz glass of wine (148 mL), or one 1 oz glass of hard liquor (44 mL). General instructions  Schedule regular health, dental, and eye exams.  Stay current with your vaccines.  Tell your health care provider if: ? You often feel depressed. ? You have ever been abused or do not feel safe at  home. Summary  Adopting a healthy lifestyle and getting preventive care are important in promoting health and wellness.  Follow your health care provider's instructions about healthy diet, exercising, and getting tested or screened for diseases.  Follow your health care provider's instructions on monitoring your cholesterol and blood pressure. This information is not intended to replace advice given to you by your health care provider. Make sure you discuss any questions you have with your health care provider. Document Revised: 02/12/2018 Document Reviewed: 02/12/2018 Elsevier Patient Education  2021 [employer]. Tdap (Tetanus, Diphtheria, Pertussis) Vaccine: What You Need to Know 1. Why get vaccinated? Tdap vaccine can prevent tetanus, diphtheria, and pertussis. Diphtheria and pertussis spread from person to person. Tetanus enters the body through cuts or wounds.  TETANUS (T) causes painful stiffening of the muscles. Tetanus can lead to serious health problems, including being unable to open the mouth, having trouble swallowing and breathing, or death.  DIPHTHERIA (D) can lead to difficulty breathing, heart failure, paralysis, or  death.  PERTUSSIS (aP), also known as "whooping cough," can cause uncontrollable, violent coughing that makes it hard to breathe, eat, or drink. Pertussis can be extremely serious especially in babies and young children, causing pneumonia, convulsions, brain damage, or death. In teens and adults, it can cause weight loss, loss of bladder control, passing out, and rib fractures from severe coughing. 2. Tdap vaccine Tdap is only for children 7 years and older, adolescents, and adults.  Adolescents should receive a single dose of Tdap, preferably at age 77 or 64 years. Pregnant people should get a dose of Tdap during every pregnancy, preferably during the early part of the third trimester, to help protect the newborn from pertussis. Infants are most at risk for  severe, life-threatening complications from pertussis. Adults who have never received Tdap should get a dose of Tdap. Also, adults should receive a booster dose of either Tdap or Td (a different vaccine that protects against tetanus and diphtheria but not pertussis) every 10 years, or after 5 years in the case of a severe or dirty wound or burn. Tdap may be given at the same time as other vaccines. 3. Talk with your health care provider Tell your vaccine provider if the person getting the vaccine:  Has had an allergic reaction after a previous dose of any vaccine that protects against tetanus, diphtheria, or pertussis, or has any severe, life-threatening allergies  Has had a coma, decreased level of consciousness, or prolonged seizures within 7 days after a previous dose of any pertussis vaccine (DTP, DTaP, or Tdap)  Has seizures or another nervous system problem  Has ever had Guillain-Barr Syndrome (also called "GBS")  Has had severe pain or swelling after a previous dose of any vaccine that protects against tetanus or diphtheria In some cases, your health care provider may decide to postpone Tdap vaccination until a future visit. People with minor illnesses, such as a cold, may be vaccinated. People who are moderately or severely ill should usually wait until they recover before getting Tdap vaccine.  Your health care provider can give you more information. 4. Risks of a vaccine reaction  Pain, redness, or swelling where the shot was given, mild fever, headache, feeling tired, and nausea, vomiting, diarrhea, or stomachache sometimes happen after Tdap vaccination. People sometimes faint after medical procedures, including vaccination. Tell your provider if you feel dizzy or have vision changes or ringing in the ears.  As with any medicine, there is a very remote chance of a vaccine causing a severe allergic reaction, other serious injury, or death. 5. What if there is a serious problem? An  allergic reaction could occur after the vaccinated person leaves the clinic. If you see signs of a severe allergic reaction (hives, swelling of the face and throat, difficulty breathing, a fast heartbeat, dizziness, or weakness), call 9-1-1 and get the person to the nearest hospital. For other signs that concern you, call your health care provider.  Adverse reactions should be reported to the Vaccine Adverse Event Reporting System (VAERS). Your health care provider will usually file this report, or you can do it yourself. Visit the VAERS website at www.vaers.SamedayNews.es or call 979-024-9429. VAERS is only for reporting reactions, and VAERS staff members do not give medical advice. 6. The National Vaccine Injury Compensation Program The [employer] Vaccine Injury Compensation Program (VICP) is a federal program that was created to compensate people who may have been injured by certain vaccines. Claims regarding alleged injury or death due to vaccination have  a time limit for filing, which may be as short as two years. Visit the VICP website at GoldCloset.com.ee or call 661-202-6437 to learn about the program and about filing a claim. 7. How can I learn more?  Ask your health care provider.  Call your local or state health department.  Visit the website of the Food and Drug Administration (FDA) for vaccine package inserts and additional information at TraderRating.uy.  Contact the Centers for Disease Control and Prevention (CDC): ? Call 226-828-7137 (1-800-CDC-INFO) or ? Visit CDC's website at http://hunter.com/. Vaccine Information Statement Tdap (Tetanus, Diphtheria, Pertussis) Vaccine (10/09/2019) This information is not intended to replace advice given to you by your health care provider. Make sure you discuss any questions you have with your health care provider. Document Revised: 11/04/2019 Document Reviewed: 11/04/2019 Elsevier Patient Education   2021 [employer].

## 2020-05-12 NOTE — Assessment & Plan Note (Signed)
Rechecking labs today. Await results. Call with any concerns.  

## 2020-05-13 LAB — CBC WITH DIFFERENTIAL/PLATELET
Basophils Absolute: 0.1 10*3/uL (ref 0.0–0.2)
Basos: 1 %
EOS (ABSOLUTE): 0.2 10*3/uL (ref 0.0–0.4)
Eos: 4 %
Hematocrit: 42.7 % (ref 34.0–46.6)
Hemoglobin: 14.2 g/dL (ref 11.1–15.9)
Immature Grans (Abs): 0 10*3/uL (ref 0.0–0.1)
Immature Granulocytes: 0 %
Lymphocytes Absolute: 1.7 10*3/uL (ref 0.7–3.1)
Lymphs: 30 %
MCH: 29.6 pg (ref 26.6–33.0)
MCHC: 33.3 g/dL (ref 31.5–35.7)
MCV: 89 fL (ref 79–97)
Monocytes Absolute: 0.5 10*3/uL (ref 0.1–0.9)
Monocytes: 8 %
Neutrophils Absolute: 3.3 10*3/uL (ref 1.4–7.0)
Neutrophils: 57 %
Platelets: 251 10*3/uL (ref 150–450)
RBC: 4.8 x10E6/uL (ref 3.77–5.28)
RDW: 12.8 % (ref 11.7–15.4)
WBC: 5.8 10*3/uL (ref 3.4–10.8)

## 2020-05-13 LAB — VITAMIN D 25 HYDROXY (VIT D DEFICIENCY, FRACTURES)

## 2020-05-13 LAB — COMPREHENSIVE METABOLIC PANEL

## 2020-05-13 LAB — LIPID PANEL W/O CHOL/HDL RATIO

## 2020-05-13 LAB — TSH

## 2020-05-13 NOTE — Addendum Note (Signed)
Addended by: Valerie Roys on: 05/13/2020 06:18 PM   Modules accepted: Orders

## 2020-05-14 LAB — GC/CHLAMYDIA PROBE AMP
Chlamydia trachomatis, NAA: NEGATIVE
Neisseria Gonorrhoeae by PCR: NEGATIVE

## 2020-05-16 ENCOUNTER — Encounter: Payer: Self-pay | Admitting: Family Medicine

## 2020-05-16 LAB — RPR: RPR Ser Ql: NONREACTIVE

## 2020-05-16 LAB — HEPATITIS PANEL, ACUTE
Hep A IgM: NEGATIVE
Hep B C IgM: NEGATIVE
Hep C Virus Ab: <0.1 s/co ratio (ref 0.0–0.9)
Hepatitis B Surface Ag: NEGATIVE

## 2020-05-16 LAB — HIV ANTIBODY (ROUTINE TESTING W REFLEX)

## 2020-05-16 LAB — HSV(HERPES SIMPLEX VRS) I + II AB-IGG
HSV 1 Glycoprotein G Ab, IgG: 1.37 index — ABNORMAL HIGH (ref 0.00–0.90)
HSV 2 IgG, Type Spec: 14.4 index — ABNORMAL HIGH (ref 0.00–0.90)

## 2020-05-18 ENCOUNTER — Other Ambulatory Visit: Payer: Self-pay

## 2020-05-18 DIAGNOSIS — Z0000 Encounter for general adult medical examination without abnormal findings: Secondary | ICD-10-CM

## 2020-05-18 DIAGNOSIS — E559 Vitamin D deficiency, unspecified: Secondary | ICD-10-CM

## 2020-05-18 DIAGNOSIS — Z113 Encounter for screening for infections with a predominantly sexual mode of transmission: Secondary | ICD-10-CM

## 2020-05-18 NOTE — Progress Notes (Signed)
Contacted via MyChart   Good evening Jasmine Tucker, your labs have returned.  Hepatitis panel is negative.  RPR, syphilis, is negative.  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  At this time you may never have had genital herpes outbreak, but this means you have been exposed and could possibly have outbreak in future.  You would want to let us know if this happens and then we would treat for short period.  At this time no treatment needed, would monitor for outbreaks.  The CDC has valuable information on this and I will placed link below.  Any questions? [email or web address]

## 2020-05-19 LAB — LIPID PANEL W/O CHOL/HDL RATIO
Cholesterol, Total: 199 mg/dL (ref 100–199)
HDL: 78 mg/dL (ref >39)
LDL Chol Calc (NIH): 110 mg/dL — ABNORMAL HIGH (ref 0–99)
Triglycerides: 59 mg/dL (ref 0–149)
VLDL Cholesterol Cal: 11 mg/dL (ref 5–40)

## 2020-05-19 LAB — COMPREHENSIVE METABOLIC PANEL
ALT: 26 IU/L (ref 0–32)
AST: 30 IU/L (ref 0–40)
Albumin/Globulin Ratio: 1.5 (ref 1.2–2.2)
Albumin: 4.1 g/dL (ref 3.8–4.9)
Alkaline Phosphatase: 87 IU/L (ref 44–121)
BUN/Creatinine Ratio: 19 (ref 9–23)
BUN: 9 mg/dL (ref 6–24)
Bilirubin Total: 0.6 mg/dL (ref 0.0–1.2)
CO2: 23 mmol/L (ref 20–29)
Calcium: 9.1 mg/dL (ref 8.7–10.2)
Chloride: 106 mmol/L (ref 96–106)
Creatinine, Ser: 0.48 mg/dL — ABNORMAL LOW (ref 0.57–1.00)
Globulin, Total: 2.7 g/dL (ref 1.5–4.5)
Glucose: 95 mg/dL (ref 65–99)
Potassium: 4.3 mmol/L (ref 3.5–5.2)
Sodium: 143 mmol/L (ref 134–144)
Total Protein: 6.8 g/dL (ref 6.0–8.5)
eGFR: 115 mL/min/{1.73_m2} (ref >59)

## 2020-05-19 LAB — HIV ANTIBODY (ROUTINE TESTING W REFLEX): HIV Screen 4th Generation wRfx: NONREACTIVE

## 2020-05-19 LAB — VITAMIN D 25 HYDROXY (VIT D DEFICIENCY, FRACTURES): Vit D, 25-Hydroxy: 14.1 ng/mL — ABNORMAL LOW (ref 30.0–100.0)

## 2020-05-19 LAB — TSH: TSH: 0.952 u[IU]/mL (ref 0.450–4.500)

## 2020-06-05 ENCOUNTER — Other Ambulatory Visit: Payer: Self-pay | Admitting: Family Medicine

## 2020-06-22 ENCOUNTER — Ambulatory Visit: Payer: BC Managed Care – PPO | Admitting: Obstetrics and Gynecology

## 2020-07-13 ENCOUNTER — Encounter: Payer: Self-pay | Admitting: Family Medicine

## 2020-07-13 NOTE — Telephone Encounter (Signed)
Pt called to speak with nurse or Dr. Wynetta Emery about getting an Rx for Etodolac 500mg  for her foot pain / pt also sent mychart message / please advise today if possible

## 2020-07-14 NOTE — Telephone Encounter (Signed)
Patient scheduled to see Jon Billings on Monday.

## 2020-07-18 ENCOUNTER — Ambulatory Visit: Payer: BC Managed Care – PPO | Admitting: Nurse Practitioner

## 2020-07-18 ENCOUNTER — Other Ambulatory Visit: Payer: Self-pay

## 2020-07-18 ENCOUNTER — Encounter: Payer: Self-pay | Admitting: Nurse Practitioner

## 2020-07-18 VITALS — BP 119/82 | HR 66 | Temp 98.3°F | Ht 64.0 in | Wt 272.0 lb

## 2020-07-18 DIAGNOSIS — M79673 Pain in unspecified foot: Secondary | ICD-10-CM | POA: Insufficient documentation

## 2020-07-18 MED ORDER — ETODOLAC 500 MG PO TABS: 500.0000 mg | ORAL_TABLET | Freq: Two times a day (BID) | ORAL | 1 refills | Status: DC

## 2020-07-18 NOTE — Progress Notes (Signed)
BP 119/82   Pulse 66   Temp 98.3 F (36.8 C) (Oral)   Ht _0  (1.626 m)   Wt 272 lb (123.4 kg)   LMP  (LMP Unknown)   SpO2 97%   BMI 46.69 kg/m    Subjective:    Patient ID: Jasmine Tucker, female    DOB: [birth year], 50 y.o.   MRN: [id number]  HPI: Jasmine Tucker is a 50 y.o. female  Chief Complaint  Patient presents with  . Medication Problem    Patient is here to talk to you about her pain medication wants it switched back to etodlac.    FOOT PAIN Patient states she has been on [facility] for quite some time but she still feels like she is having pain even with the medication.  Then she took one of her sisters Etodlac which helped take away her pain to where she didn't notice it at all. 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  She was told that her stomach is no longer a smaller size.   Patient states she takes the medication 1-2 times per week.  Patient usually takes it on the 3rd day of her 3 12 hour shifts.    Denies other concerns at visit today.   Relevant past medical, surgical, family and social history reviewed and updated as indicated. Interim medical history since our last visit reviewed. Allergies and medications reviewed and updated.  Review of Systems  Musculoskeletal:       Foot pain    Per HPI unless specifically indicated above     Objective:    BP 119/82   Pulse 66   Temp 98.3 F (36.8 C) (Oral)   Ht _1  (1.626 m)   Wt 272 lb (123.4 kg)   LMP  (LMP Unknown)   SpO2 97%   BMI 46.69 kg/m   Wt Readings from Last 3 Encounters:  07/18/20 272 lb (123.4 kg)  05/12/20 272 lb 3.2 oz (123.5 kg)  01/13/20 276 lb 3.2 oz (125.3 kg)    Physical Exam Vitals and nursing note reviewed.  Constitutional:      General: She is not in acute distress.    Appearance: Normal appearance. She is obese. She is not ill-appearing, toxic-appearing or diaphoretic.  HENT:     Head: Normocephalic.     Right Ear: External ear normal.      Left Ear: External ear normal.     Nose: Nose normal.     Mouth/Throat:     Mouth: Mucous membranes are moist.     Pharynx: Oropharynx is clear.  Eyes:     General:        Right eye: No discharge.        Left eye: No discharge.     Extraocular Movements: Extraocular movements intact.     Conjunctiva/sclera: Conjunctivae normal.     Pupils: Pupils are equal, round, and reactive to light.  Cardiovascular:     Rate and Rhythm: Normal rate and regular rhythm.     Heart sounds: No murmur heard.   Pulmonary:     Effort: Pulmonary effort is normal. No respiratory distress.     Breath sounds: Normal breath sounds. No wheezing or rales.  Musculoskeletal:     Cervical back: Normal range of motion and neck supple.  Skin:    General: Skin is warm and dry.     Capillary Refill: Capillary refill takes less than 2 seconds.  Neurological:  General: No focal deficit present.     Mental Status: She is alert and oriented to person, place, and time. Mental status is at baseline.  Psychiatric:        Mood and Affect: Mood normal.        Behavior: Behavior normal.        Thought Content: Thought content normal.        Judgment: Judgment normal.     Results for orders placed or performed in visit on 05/18/20  HIV Antibody (routine testing w rflx)  Result Value Ref Range   HIV Screen 4th Generation wRfx Non Reactive Non Reactive  VITAMIN D 25 Hydroxy (Vit-D Deficiency, Fractures)  Result Value Ref Range   Vit D, 25-Hydroxy 14.1 (L) 30.0 - 100.0 ng/mL  TSH  Result Value Ref Range   TSH 0.952 0.450 - 4.500 uIU/mL  Lipid Panel w/o Chol/HDL Ratio  Result Value Ref Range   Cholesterol, Total 199 100 - 199 mg/dL   Triglycerides 59 0 - 149 mg/dL   HDL 78 >39 mg/dL   VLDL Cholesterol Cal 11 5 - 40 mg/dL   LDL Chol Calc (NIH) 110 (H) 0 - 99 mg/dL  Comprehensive metabolic panel  Result Value Ref Range   Glucose 95 65 - 99 mg/dL   BUN 9 6 - 24 mg/dL   Creatinine, Ser 0.48 (L) 0.57 - 1.00  mg/dL   eGFR 115 >59 mL/min/1.73   BUN/Creatinine Ratio 19 9 - 23   Sodium 143 134 - 144 mmol/L   Potassium 4.3 3.5 - 5.2 mmol/L   Chloride 106 96 - 106 mmol/L   CO2 23 20 - 29 mmol/L   Calcium 9.1 8.7 - 10.2 mg/dL   Total Protein 6.8 6.0 - 8.5 g/dL   Albumin 4.1 3.8 - 4.9 g/dL   Globulin, Total 2.7 1.5 - 4.5 g/dL   Albumin/Globulin Ratio 1.5 1.2 - 2.2   Bilirubin Total 0.6 0.0 - 1.2 mg/dL   Alkaline Phosphatase 87 44 - 121 IU/L   AST 30 0 - 40 IU/L   ALT 26 0 - 32 IU/L      Assessment & Plan:   Problem List Items Addressed This Visit      Other   Pain of foot - Primary    Etadolac given at visit today. 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 Patient aware of the potential side effects. Declines specialist referral at this time.  Will continue to monitor at future visits.          Follow up plan: Return if symptoms worsen or fail to improve.   A total of 20 minutes were spent on this encounter today.  When total time is documented, this includes both the face-to-face and non-face-to-face time personally spent before, during and after the visit on the date of the encounter.

## 2020-07-18 NOTE — Assessment & Plan Note (Signed)
Etadolac given at visit today. 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 Patient aware of the potential side effects. Declines specialist referral at this time.  Will continue to monitor at future visits.

## 2020-09-16 ENCOUNTER — Ambulatory Visit: Payer: BC Managed Care – PPO | Admitting: Nurse Practitioner

## 2020-09-16 ENCOUNTER — Other Ambulatory Visit: Payer: Self-pay

## 2020-09-16 ENCOUNTER — Encounter: Payer: Self-pay | Admitting: Nurse Practitioner

## 2020-09-16 VITALS — BP 141/83 | HR 62 | Temp 98.0°F | Wt 266.4 lb

## 2020-09-16 DIAGNOSIS — Z113 Encounter for screening for infections with a predominantly sexual mode of transmission: Secondary | ICD-10-CM | POA: Diagnosis not present

## 2020-09-16 DIAGNOSIS — R300 Dysuria: Secondary | ICD-10-CM

## 2020-09-16 DIAGNOSIS — L299 Pruritus, unspecified: Secondary | ICD-10-CM | POA: Diagnosis not present

## 2020-09-16 LAB — URINALYSIS, ROUTINE W REFLEX MICROSCOPIC
Bilirubin, UA: NEGATIVE
Glucose, UA: NEGATIVE
Ketones, UA: NEGATIVE
Leukocytes,UA: NEGATIVE
Nitrite, UA: NEGATIVE
Protein,UA: NEGATIVE
Specific Gravity, UA: 1.025 (ref 1.005–1.030)
Urobilinogen, Ur: 1 mg/dL (ref 0.2–1.0)
pH, UA: 7 (ref 5.0–7.5)

## 2020-09-16 LAB — WET PREP FOR TRICH, YEAST, CLUE
Clue Cell Exam: NEGATIVE
Trichomonas Exam: NEGATIVE
Yeast Exam: NEGATIVE

## 2020-09-16 LAB — MICROSCOPIC EXAMINATION
Bacteria, UA: NONE SEEN
WBC, UA: NONE SEEN /hpf (ref 0–5)

## 2020-09-16 MED ORDER — METRONIDAZOLE 500 MG PO TABS: 500.0000 mg | ORAL_TABLET | Freq: Two times a day (BID) | ORAL | 0 refills | Status: AC

## 2020-09-16 NOTE — Progress Notes (Signed)
BP (!) 141/83   Pulse 62   Temp 98 F (36.7 C)   Wt 266 lb 6.4 oz (120.8 kg)   LMP  (LMP Unknown)   SpO2 99%   BMI 45.73 kg/m    Subjective:    Patient ID: Jasmine Tucker, female    DOB: 19-Jul-1970, 50 y.o.   MRN: [id number]  HPI: Jasmine Tucker is a 50 y.o. female  Chief Complaint  Patient presents with   Vaginal Itching    Patient states since Tuesday she has been feeling some vaginal itching and irritation.    VAGINAL ISSUE Duration: days Discharge description:  none   Pruritus: yes Dysuria: no Malodorous: no Urinary frequency: no Fevers: no Abdominal pain: no  Sexual activity: practicing safe sex History of sexually transmitted diseases: no Recent antibiotic use: no Context: same, frequent yeast infection and BV Treatments attempted: none  Relevant past medical, surgical, family and social history reviewed and updated as indicated. Interim medical history since our last visit reviewed. Allergies and medications reviewed and updated.  Review of Systems  Constitutional:  Negative for fever.  Genitourinary:  Negative for dysuria, frequency and vaginal discharge.   Per HPI unless specifically indicated above     Objective:    BP (!) 141/83   Pulse 62   Temp 98 F (36.7 C)   Wt 266 lb 6.4 oz (120.8 kg)   LMP  (LMP Unknown)   SpO2 99%   BMI 45.73 kg/m   Wt Readings from Last 3 Encounters:  09/16/20 266 lb 6.4 oz (120.8 kg)  07/18/20 272 lb (123.4 kg)  05/12/20 272 lb 3.2 oz (123.5 kg)    Physical Exam Vitals and nursing note reviewed.  Constitutional:      General: She is not in acute distress.    Appearance: Normal appearance. She is normal weight. She is not ill-appearing, toxic-appearing or diaphoretic.  HENT:     Head: Normocephalic.     Right Ear: External ear normal.     Left Ear: External ear normal.     Nose: Nose normal.     Mouth/Throat:     Mouth: Mucous membranes are moist.     Pharynx: Oropharynx is clear.  Eyes:      General:        Right eye: No discharge.        Left eye: No discharge.     Extraocular Movements: Extraocular movements intact.     Conjunctiva/sclera: Conjunctivae normal.     Pupils: Pupils are equal, round, and reactive to light.  Cardiovascular:     Rate and Rhythm: Normal rate and regular rhythm.     Heart sounds: No murmur heard. Pulmonary:     Effort: Pulmonary effort is normal. No respiratory distress.     Breath sounds: Normal breath sounds. No wheezing or rales.  Abdominal:     General: Abdomen is flat. Bowel sounds are normal. There is no distension.     Palpations: Abdomen is soft.     Tenderness: There is no abdominal tenderness. There is no right CVA tenderness or left CVA tenderness.  Musculoskeletal:     Cervical back: Normal range of motion and neck supple.  Skin:    General: Skin is warm and dry.     Capillary Refill: Capillary refill takes less than 2 seconds.  Neurological:     General: No focal deficit present.     Mental Status: She is alert and oriented to person, place, and  time. Mental status is at baseline.  Psychiatric:        Mood and Affect: Mood normal.        Behavior: Behavior normal.        Thought Content: Thought content normal.        Judgment: Judgment normal.    Results for orders placed or performed in visit on 05/18/20  HIV Antibody (routine testing w rflx)  Result Value Ref Range   HIV Screen 4th Generation wRfx Non Reactive Non Reactive  VITAMIN D 25 Hydroxy (Vit-D Deficiency, Fractures)  Result Value Ref Range   Vit D, 25-Hydroxy 14.1 (L) 30.0 - 100.0 ng/mL  TSH  Result Value Ref Range   TSH 0.952 0.450 - 4.500 uIU/mL  Lipid Panel w/o Chol/HDL Ratio  Result Value Ref Range   Cholesterol, Total 199 100 - 199 mg/dL   Triglycerides 59 0 - 149 mg/dL   HDL 78 >39 mg/dL   VLDL Cholesterol Cal 11 5 - 40 mg/dL   LDL Chol Calc (NIH) 110 (H) 0 - 99 mg/dL  Comprehensive metabolic panel  Result Value Ref Range   Glucose 95 65 - 99  mg/dL   BUN 9 6 - 24 mg/dL   Creatinine, Ser 0.48 (L) 0.57 - 1.00 mg/dL   eGFR 115 >59 mL/min/1.73   BUN/Creatinine Ratio 19 9 - 23   Sodium 143 134 - 144 mmol/L   Potassium 4.3 3.5 - 5.2 mmol/L   Chloride 106 96 - 106 mmol/L   CO2 23 20 - 29 mmol/L   Calcium 9.1 8.7 - 10.2 mg/dL   Total Protein 6.8 6.0 - 8.5 g/dL   Albumin 4.1 3.8 - 4.9 g/dL   Globulin, Total 2.7 1.5 - 4.5 g/dL   Albumin/Globulin Ratio 1.5 1.2 - 2.2   Bilirubin Total 0.6 0.0 - 1.2 mg/dL   Alkaline Phosphatase 87 44 - 121 IU/L   AST 30 0 - 40 IU/L   ALT 26 0 - 32 IU/L      Assessment & Plan:   Problem List Items Addressed This Visit   None Visit Diagnoses     Pruritus    -  Primary   Wet prep showed lots of bacteria. Due to patient having frequent BV infections will treat with flagyl.  Urine sent for Gon/CT/Trich. Will make f/u post labs.   Routine screening for STI (sexually transmitted infection)       Will send for Gon/Chlam/Trich.  Will make recommendations based on lab results.   Relevant Orders   Chlamydia/Gonococcus/Trichomonas, NAA([facility])   Dysuria       UA negative in office today.  Patient treated for BV.   Relevant Orders   WET PREP FOR TRICH, YEAST, CLUE   Urinalysis, Routine w reflex microscopic        Follow up plan: Return if symptoms worsen or fail to improve.

## 2020-09-16 NOTE — Progress Notes (Signed)
Results discussed with patient during visit.

## 2020-09-20 LAB — CHLAMYDIA/GONOCOCCUS/TRICHOMONAS, NAA
Chlamydia by NAA: NEGATIVE
Gonococcus by NAA: NEGATIVE
Trich vag by NAA: NEGATIVE

## 2020-09-20 NOTE — Progress Notes (Signed)
Hi Shamir. Your urine test was negative.  Please let me know if you have any questions.

## 2020-11-08 DIAGNOSIS — M79673 Pain in unspecified foot: Secondary | ICD-10-CM

## 2020-11-09 DIAGNOSIS — R0981 Nasal congestion: Secondary | ICD-10-CM

## 2020-11-09 DIAGNOSIS — F418 Other specified anxiety disorders: Secondary | ICD-10-CM

## 2020-11-17 ENCOUNTER — Telehealth: Payer: Self-pay

## 2020-11-17 NOTE — Telephone Encounter (Signed)
Patient called to advise that she received a letter stating she must find a new provider. Patient states she is working to establish with a new provider but would like a refill on Flonase, Ativan, and Lodine to last her until she sees a new provider.  Please call patient to let her know if the refills are approved. Patient can be reached at [phone].

## 2020-11-21 ENCOUNTER — Ambulatory Visit: Payer: BC Managed Care – PPO | Admitting: Nurse Practitioner

## 2020-11-21 ENCOUNTER — Other Ambulatory Visit: Payer: Self-pay

## 2020-11-21 ENCOUNTER — Encounter: Payer: Self-pay | Admitting: Nurse Practitioner

## 2020-11-21 VITALS — BP 153/96 | HR 71 | Temp 98.4°F | Resp 20 | Wt 265.6 lb

## 2020-11-21 DIAGNOSIS — Z113 Encounter for screening for infections with a predominantly sexual mode of transmission: Secondary | ICD-10-CM

## 2020-11-21 DIAGNOSIS — J069 Acute upper respiratory infection, unspecified: Secondary | ICD-10-CM

## 2020-11-21 MED ORDER — FLUTICASONE PROPIONATE 50 MCG/ACT NA SUSP: 2.0000 | Freq: Every day | NASAL | 0 refills | Status: AC

## 2020-11-21 MED ORDER — ETODOLAC 500 MG PO TABS: 500.0000 mg | ORAL_TABLET | Freq: Two times a day (BID) | ORAL | 0 refills | Status: DC

## 2020-11-21 MED ORDER — LORAZEPAM 0.5 MG PO TABS: ORAL_TABLET | ORAL | 0 refills | Status: AC

## 2020-11-21 NOTE — Telephone Encounter (Signed)
30 day supply of medications sent to the pharmacy.

## 2020-11-21 NOTE — Progress Notes (Signed)
Acute Office Visit  Subjective:    Patient ID: Jasmine Tucker, female    DOB: September 17, 1970, 50 y.o.   MRN: [id number]  Chief Complaint  Patient presents with   Sore Throat     HPI Patient is in today for sore throat for 1 week. She states that she is concerned for STI throat infection as it started after oral intercourse last week. She would also like to be tested for covid-19 and her hemoglobin rechecked for intermittent shortness of breath.   UPPER RESPIRATORY TRACT INFECTION Worst symptom: sore throat Fever: no Cough: yes Shortness of breath:  intermittent Wheezing: no Chest pain: no Chest tightness: no Chest congestion: no Nasal congestion: yes Runny nose: no Post nasal drip: yes Sneezing: no Sore throat: yes Swollen glands: no Sinus pressure: no Headache: yes Face pain: no Toothache: no Ear pain: no  Ear pressure: yes -at first, not now Eyes red/itching:no Eye drainage/crusting: no  Vomiting: no Rash: no Fatigue: yes Sick contacts: no Strep contacts: no  Context: worse Recurrent sinusitis: no Relief with OTC cold/cough medications: yes  Treatments attempted: anti-histamine - claritin   Past Medical History:  Diagnosis Date   Anemia    Anxiety    BRCA negative 2016   21 panel test done at [place]   Family history of breast cancer    Family history of ovarian cancer    GERD (gastroesophageal reflux disease)    Menorrhagia    Plantar fasciitis 2017   Thrombocytosis     Past Surgical History:  Procedure Laterality Date   ABDOMINAL HYSTERECTOMY     BREAST SURGERY     breast biopsy 4 months ago, left breast   CHOLECYSTECTOMY     GASTRIC BYPASS  2000   LAPAROSCOPIC BILATERAL SALPINGECTOMY Bilateral 07/20/2014   Procedure: LAPAROSCOPIC BILATERAL SALPINGECTOMY;  Surgeon: Gae Dry, MD;  Location: [facility];  Service: Gynecology;  Laterality: Bilateral;   LAPAROSCOPIC HYSTERECTOMY N/A 07/20/2014   Procedure: HYSTERECTOMY TOTAL  LAPAROSCOPIC;  Surgeon: Gae Dry, MD;  Location: [facility];  Service: Gynecology;  Laterality: N/A;   LAPAROSCOPIC OOPHERECTOMY Bilateral 07/20/2014   Procedure: LAPAROSCOPIC OOPHERECTOMY-(POSSIBLE);  Surgeon: Gae Dry, MD;  Location: [facility];  Service: Gynecology;  Laterality: Bilateral;    Family History  Problem Relation Age of Onset   Ovarian cancer Mother 71   Breast cancer Maternal Aunt 65   Cancer Paternal Aunt        either cervical or ovarian cancer   Cervical cancer Maternal Aunt        reportedly the cause of death was cervical cancer   Lung cancer Paternal Aunt    Breast cancer Cousin    Breast cancer Cousin     Social History   Socioeconomic History   Marital status: Married    Spouse name: Orie Cuttino   Number of children: 1   Years of education: Not on file   Highest education level: Master's degree (e.g., MA, MS, MEng, MEd, MSW, MBA)  Occupational History   Occupation: [employer]  Tobacco Use   Smoking status: Never   Smokeless tobacco: Never  Vaping Use   Vaping Use: Never used  Substance and Sexual Activity   Alcohol use: Yes    Comment: socially   Drug use: No   Sexual activity: Not Currently  Other Topics Concern   Not on file  Social History Narrative   Not on file   Social Determinants of Health   Financial  Resource Strain: Not on file  Food Insecurity: Not on file  Transportation Needs: Not on file  Physical Activity: Not on file  Stress: Not on file  Social Connections: Not on file  Intimate Partner Violence: Not on file    Outpatient Medications Prior to Visit  Medication Sig Dispense Refill   busPIRone (BUSPAR) 15 MG tablet Take 1 tablet (15 mg total) by mouth 2 (two) times daily. 180 tablet 1   diclofenac Sodium (VOLTAREN) 1 % GEL Apply 4 g topically 4 (four) times daily. 100 g 12   etodolac (LODINE) 500 MG tablet Take 1 tablet (500 mg total) by mouth 2 (two) times daily. 60 tablet 0   fluticasone (FLONASE) 50  MCG/ACT nasal spray Place 2 sprays into both nostrils daily. 48 g 0   LORazepam (ATIVAN) 0.5 MG tablet Take one tab daily as needed for severe panic attacks. Not to be used every day. 20 tablet 0   No facility-administered medications prior to visit.    Allergies  Allergen Reactions   Latex Itching and Rash    Review of Systems  Constitutional:  Positive for fatigue. Negative for fever.  HENT:  Positive for congestion, postnasal drip and sore throat. Negative for ear pain and rhinorrhea.   Eyes: Negative.   Respiratory:  Positive for cough and shortness of breath (intermittent).   Cardiovascular: Negative.   Gastrointestinal:  Positive for nausea.  Genitourinary: Negative.   Skin: Negative.   Neurological: Negative.   Psychiatric/Behavioral:  The patient is nervous/anxious.       Objective:    Physical Exam Vitals and nursing note reviewed.  Constitutional:      General: She is not in acute distress.    Appearance: Normal appearance.  HENT:     Head: Normocephalic.     Right Ear: Tympanic membrane, ear canal and external ear normal.     Left Ear: Tympanic membrane, ear canal and external ear normal.     Mouth/Throat:     Mouth: Mucous membranes are moist.     Pharynx: Oropharynx is clear. Posterior oropharyngeal erythema present.  Eyes:     Conjunctiva/sclera: Conjunctivae normal.  Cardiovascular:     Rate and Rhythm: Normal rate and regular rhythm.     Pulses: Normal pulses.     Heart sounds: Normal heart sounds.  Pulmonary:     Effort: Pulmonary effort is normal.     Breath sounds: Normal breath sounds.  Musculoskeletal:     Cervical back: Normal range of motion.  Skin:    General: Skin is warm.  Neurological:     General: No focal deficit present.     Mental Status: She is alert and oriented to person, place, and time.  Psychiatric:        Mood and Affect: Mood normal.        Behavior: Behavior normal.        Thought Content: Thought content normal.         Judgment: Judgment normal.    BP (!) 153/96 (BP Location: Left Arm, Patient Position: Sitting)   Pulse 71   Temp 98.4 F (36.9 C) (Oral)   Resp 20   Wt 265 lb 9.6 oz (120.5 kg)   LMP  (LMP Unknown)   SpO2 98%   BMI 45.59 kg/m  Wt Readings from Last 3 Encounters:  11/21/20 265 lb 9.6 oz (120.5 kg)  09/16/20 266 lb 6.4 oz (120.8 kg)  07/18/20 272 lb (123.4 kg)    Health  Maintenance Due  Topic Date Due   PAP SMEAR-Modifier  Never done   Zoster Vaccines- Shingrix (1 of 2) Never done   COVID-19 Vaccine (3 - Booster for Pfizer series) 12/29/2019   INFLUENZA VACCINE  Never done    There are no preventive care reminders to display for this patient.   Lab Results  Component Value Date   TSH 0.952 05/18/2020   Lab Results  Component Value Date   WBC 5.8 05/12/2020   HGB 14.2 05/12/2020   HCT 42.7 05/12/2020   MCV 89 05/12/2020   PLT 251 05/12/2020   Lab Results  Component Value Date   NA 143 05/18/2020   K 4.3 05/18/2020   CO2 23 05/18/2020   GLUCOSE 95 05/18/2020   BUN 9 05/18/2020   CREATININE 0.48 (L) 05/18/2020   BILITOT 0.6 05/18/2020   ALKPHOS 87 05/18/2020   AST 30 05/18/2020   ALT 26 05/18/2020   PROT 6.8 05/18/2020   ALBUMIN 4.1 05/18/2020   CALCIUM 9.1 05/18/2020   ANIONGAP 11 07/26/2018   EGFR 115 05/18/2020   Lab Results  Component Value Date   CHOL 199 05/18/2020   Lab Results  Component Value Date   HDL 78 05/18/2020   Lab Results  Component Value Date   LDLCALC 110 (H) 05/18/2020   Lab Results  Component Value Date   TRIG 59 05/18/2020   No results found for: CHOLHDL No results found for: HGBA1C     Assessment & Plan:   Problem List Items Addressed This Visit   None Visit Diagnoses     Upper respiratory tract infection, unspecified type    -  Primary   Strep negative. Covid-19 & CBC pending. Discussed rest and incresaing fluids. She can do warm salt water gargles to help with sore throat.   Relevant Orders   CBC with  Differential/Platelet   Novel Coronavirus, NAA ([facility])   Rapid Strep Screen ([facility] ONLY) (Completed)   Screen for STD (sexually transmitted disease)       Will check STI panel today per patient request   Relevant Orders   GC NAA, Pharyngeal   HIV Antibody (routine testing w rflx)   RPR   HSV(herpes simplex vrs) 1+2 ab-IgG   Acute Viral Hepatitis (HAV, HBV, HCV)   Chlamydia/GC NAA, Confirmation        No orders of the defined types were placed in this encounter.    Charyl Dancer, NP

## 2020-11-21 NOTE — Patient Instructions (Signed)
Gargle with warm salt water.

## 2020-11-22 LAB — CBC WITH DIFFERENTIAL/PLATELET
Basophils Absolute: 0.1 10*3/uL (ref 0.0–0.2)
Basos: 1 %
EOS (ABSOLUTE): 0.2 10*3/uL (ref 0.0–0.4)
Eos: 4 %
Hematocrit: 40.5 % (ref 34.0–46.6)
Hemoglobin: 13.4 g/dL (ref 11.1–15.9)
Immature Grans (Abs): 0 10*3/uL (ref 0.0–0.1)
Immature Granulocytes: 0 %
Lymphocytes Absolute: 1.3 10*3/uL (ref 0.7–3.1)
Lymphs: 28 %
MCH: 29.5 pg (ref 26.6–33.0)
MCHC: 33.1 g/dL (ref 31.5–35.7)
MCV: 89 fL (ref 79–97)
Monocytes Absolute: 0.4 10*3/uL (ref 0.1–0.9)
Monocytes: 8 %
Neutrophils Absolute: 2.8 10*3/uL (ref 1.4–7.0)
Neutrophils: 59 %
Platelets: 221 10*3/uL (ref 150–450)
RBC: 4.54 x10E6/uL (ref 3.77–5.28)
RDW: 13.4 % (ref 11.7–15.4)
WBC: 4.7 10*3/uL (ref 3.4–10.8)

## 2020-11-22 LAB — HCV INTERPRETATION

## 2020-11-22 LAB — HSV(HERPES SIMPLEX VRS) I + II AB-IGG
HSV 1 Glycoprotein G Ab, IgG: 2.39 index — ABNORMAL HIGH (ref 0.00–0.90)
HSV 2 IgG, Type Spec: 15.9 index — ABNORMAL HIGH (ref 0.00–0.90)

## 2020-11-22 LAB — SARS-COV-2, NAA 2 DAY TAT

## 2020-11-22 LAB — NOVEL CORONAVIRUS, NAA: SARS-CoV-2, NAA: NOT DETECTED

## 2020-11-22 LAB — ACUTE VIRAL HEPATITIS (HAV, HBV, HCV)
HCV Ab: <0.1 s/co ratio (ref 0.0–0.9)
Hep A IgM: NEGATIVE
Hep B C IgM: NEGATIVE
Hepatitis B Surface Ag: NEGATIVE

## 2020-11-22 LAB — RPR: RPR Ser Ql: NONREACTIVE

## 2020-11-22 LAB — HIV ANTIBODY (ROUTINE TESTING W REFLEX): HIV Screen 4th Generation wRfx: NONREACTIVE

## 2020-11-23 ENCOUNTER — Ambulatory Visit: Payer: BC Managed Care – PPO | Admitting: Dermatology

## 2020-11-23 ENCOUNTER — Ambulatory Visit: Payer: Self-pay | Admitting: *Deleted

## 2020-11-23 NOTE — Telephone Encounter (Signed)
Patient following up from [place] on 11/21/20. Continues to have sinus pressure below and above eyes, headaches and nasal congestion sometimes with thick drainage.  No fever/cough. Asa and current decongestant not helping.  Advised Tylenol and OTC NS nasal spray. Patient requesting for prescription to help with these sinus symptoms. Requesting for communication via MyChart if needed.  [place] on file.

## 2020-11-24 LAB — GC NAA, PHARYNGEAL: N GONORRHOEA RRNA NPH QL PCR: NEGATIVE

## 2020-11-24 MED ORDER — AMOXICILLIN-POT CLAVULANATE 875-125 MG PO TABS: 1.0000 | ORAL_TABLET | Freq: Two times a day (BID) | ORAL | 0 refills | Status: DC

## 2020-11-24 NOTE — Telephone Encounter (Signed)
MyChart message sent to patient letting her know about the antibiotic as requested.

## 2020-11-24 NOTE — Addendum Note (Signed)
Addended by: Vance Peper A on: 11/24/2020 07:51 AM   Modules accepted: Orders

## 2020-11-25 LAB — CHLAMYDIA/GC NAA, CONFIRMATION
Chlamydia trachomatis, NAA: NEGATIVE
Neisseria gonorrhoeae, NAA: NEGATIVE

## 2020-11-26 LAB — CULTURE, GROUP A STREP: Strep A Culture: NEGATIVE

## 2020-11-26 LAB — RAPID STREP SCREEN (MED CTR MEBANE ONLY): Strep Gp A Ag, IA W/Reflex: NEGATIVE

## 2020-12-06 ENCOUNTER — Ambulatory Visit: Payer: BC Managed Care – PPO | Admitting: Podiatry

## 2020-12-11 ENCOUNTER — Other Ambulatory Visit: Payer: Self-pay | Admitting: Nurse Practitioner

## 2020-12-20 ENCOUNTER — Encounter: Payer: Self-pay | Admitting: Podiatry

## 2020-12-20 ENCOUNTER — Ambulatory Visit (INDEPENDENT_AMBULATORY_CARE_PROVIDER_SITE_OTHER): Payer: BC Managed Care – PPO

## 2020-12-20 ENCOUNTER — Other Ambulatory Visit: Payer: Self-pay

## 2020-12-20 ENCOUNTER — Ambulatory Visit: Payer: BC Managed Care – PPO | Admitting: Podiatry

## 2020-12-20 DIAGNOSIS — Q666 Other congenital valgus deformities of feet: Secondary | ICD-10-CM

## 2020-12-20 DIAGNOSIS — M25371 Other instability, right ankle: Secondary | ICD-10-CM

## 2020-12-20 NOTE — Progress Notes (Signed)
Subjective:  Patient ID: Jasmine Tucker, female    DOB: [birth year],  MRN: [id number]  Chief Complaint  Patient presents with   Foot Pain    Bilateral foot pain doesn't happen constantly like 1-2 months at a time PT stated that she will get random pain in feet and ankles. She stated that she will get the pain if she eats sugar. She works 12 hours on her feet .     50 y.o. female presents with the above complaint.  Patient presents with complaint of right ankle instability and bilateral semiflexible flatfoot.  Patient states that the right ankle sometimes occasionally gets painful.  She works on her feet 12-hour shifts.  She states that she gets random pain in her foot and ankle after she has been on her period for long period of time.  She has tried some stiff shoes which helps considerably.  She would like to discuss other shoe options.  She would like to discuss bracing as well.  She has not seen anyone else prior to seeing me for this.  She denies any other acute complaints she does not wear orthotics.   Review of Systems: Negative except as noted in the HPI. Denies N/V/F/Ch.  Past Medical History:  Diagnosis Date   Anemia    Anxiety    BRCA negative 2016   21 panel test done at [place] history of breast cancer    Family history of ovarian cancer    GERD (gastroesophageal reflux disease)    Menorrhagia    Plantar fasciitis 2017   Thrombocytosis     Current Outpatient Medications:    amoxicillin-clavulanate (AUGMENTIN) 875-125 MG tablet, Take 1 tablet by mouth 2 (two) times daily., Disp: 20 tablet, Rfl: 0   busPIRone (BUSPAR) 15 MG tablet, Take 1 tablet (15 mg total) by mouth 2 (two) times daily., Disp: 180 tablet, Rfl: 1   diclofenac Sodium (VOLTAREN) 1 % GEL, Apply 4 g topically 4 (four) times daily., Disp: 100 g, Rfl: 12   etodolac (LODINE) 500 MG tablet, Take 1 tablet (500 mg total) by mouth 2 (two) times daily., Disp: 60 tablet, Rfl: 0   fluticasone  (FLONASE) 50 MCG/ACT nasal spray, Place 2 sprays into both nostrils daily., Disp: 48 g, Rfl: 0   LORazepam (ATIVAN) 0.5 MG tablet, Take one tab daily as needed for severe panic attacks. Not to be used every day., Disp: 20 tablet, Rfl: 0  Social History   Tobacco Use  Smoking Status Never  Smokeless Tobacco Never    Allergies  Allergen Reactions   Latex Itching and Rash   Objective:  There were no vitals filed for this visit. There is no height or weight on file to calculate BMI. Constitutional Well developed. Well nourished.  Vascular Dorsalis pedis pulses palpable bilaterally. Posterior tibial pulses palpable bilaterally. Capillary refill normal to all digits.  No cyanosis or clubbing noted. Pedal hair growth normal.  Neurologic Normal speech. Oriented to person, place, and time. Epicritic sensation to light touch grossly present bilaterally.  Dermatologic Nails well groomed and normal in appearance. No open wounds. No skin lesions.  Orthopedic: Negative anterior drawer test or talar tilt test.  Very mild pain at the ATFL region.  Mild pain with plantarflexion inversion of the foot.  No deep intra-articular ankle pain noted.  Gait examination shows semiflexible pes planovalgus deformity calcaneovalgus to many toe signs partially able to recruit the arch with dorsiflexion of the hallux.   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  No other bony abnormalities identified Assessment:   1. Ankle instability, right   2. Pes planovalgus    Plan:  Patient was evaluated and treated and all questions answered.  Right ankle instability -I explained the patient the etiology of ankle instability and various treatment options were extensively discussed.  Given that it is very sporadic in nature I believe patient may just benefit from bracing as opposed to further  evaluation.  Patient agrees with the plan and Tri-Lock ankle brace was dispensed   Semiflexible pes planovalgus -I explained the patient the etiology of pes planovalgus numbers treatment options were discussed.  Given that she is having some arch and heel pain I believe she would benefit from custom-made orthotics to help control the hindfoot motion support the arch of the foot take the stress away from the heel.  Patient agrees with plan like to proceed with orthotics -She was casted for orthotics  No follow-ups on file.

## 2020-12-26 ENCOUNTER — Telehealth: Payer: Self-pay | Admitting: Podiatry

## 2020-12-26 NOTE — Telephone Encounter (Signed)
Pt call and said she didn't get her medication to the pharmacy its been a week she have been waitting a week

## 2020-12-27 ENCOUNTER — Telehealth: Payer: Self-pay | Admitting: Nurse Practitioner

## 2020-12-27 ENCOUNTER — Encounter: Payer: Self-pay | Admitting: Nurse Practitioner

## 2020-12-27 NOTE — Telephone Encounter (Signed)
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  I was in last week with Dr. Posey Pronto.  He was supposed to prescribe or refill my medicine.  Can somebody call me and tell me why he hasn't refilled my medicine.  It's been a week now and I called yesterday to talk about the same thing and still it has not been done!  If you all don't want to treat people than you should say that!  I want my $100 deposit back."

## 2020-12-27 NOTE — Telephone Encounter (Signed)
I attempted to call the patient.  I left her a message to call and let us know which medication she's referring to.  I then called her work number.  I am calling you in regards to your message.  Dr. Posey Pronto wants to know what medication are you preferring to.  "You telling me he doesn't know.  We had a long discussion about this while I was there.  I am not pleased with my service.  He's supposed to be a specialist.  It's the Etodolac. I had asked him if this was the best treatment for my foot.  He said yes and said that he would call me in a prescription for it.  It has been a week since I've had this medication and my foot is killing me.  Please make sure you tell him that.  I'm not pleased at all."  I will let him know.

## 2020-12-27 NOTE — Telephone Encounter (Signed)
I called the patient and LM on her VM for pt to call the office so I can ask her what medication.

## 2020-12-27 NOTE — Telephone Encounter (Signed)
Patient called in requesting an update on medication refill for Etodolac.  [facility] [place], patient states that pharmacy did not receive it and it couldn't be filled.  She has requested for the clinical staff to call the pharmacy for an update and also call her at [phone] to advise as well.    Please note that patient was dismissed from the practice 12/10/2020.

## 2020-12-27 NOTE — Telephone Encounter (Signed)
Patient called stating she was suppose to have a RX sent over to the pharmacy and its not there. Patient uses [facility] in [place].

## 2020-12-28 MED ORDER — ETODOLAC 500 MG PO TABS: 500.0000 mg | ORAL_TABLET | Freq: Two times a day (BID) | ORAL | 0 refills | Status: AC

## 2020-12-28 NOTE — Telephone Encounter (Signed)
Called pharmacy to check on patient medication, pharmacy stated that the medication was there waiting on her to pick up Patient was made aware that her prescription is at the pharmacy waiting on pick up.

## 2020-12-28 NOTE — Addendum Note (Signed)
Addended by: Boneta Lucks on: 12/28/2020 08:06 AM   Modules accepted: Orders

## 2021-01-18 ENCOUNTER — Telehealth: Payer: Self-pay | Admitting: Podiatry

## 2021-01-18 NOTE — Telephone Encounter (Signed)
Orthotics in [place] to be taken to [facility].. lvm for pt to call to schedule an appt to pick them up.. there is available appts tomorrow 11.17 in [place] office.Marland KitchenMarland Kitchen

## 2021-01-30 ENCOUNTER — Other Ambulatory Visit: Payer: Self-pay | Admitting: Podiatry

## 2021-01-30 NOTE — Telephone Encounter (Signed)
Please advise 

## 2021-02-28 ENCOUNTER — Other Ambulatory Visit: Payer: Self-pay

## 2021-02-28 ENCOUNTER — Other Ambulatory Visit (HOSPITAL_COMMUNITY)
Admission: RE | Admit: 2021-02-28 | Discharge: 2021-02-28 | Disposition: A | Discharge status: Home / Self Care | Payer: BC Managed Care – PPO | Source: Ambulatory Visit | Attending: Obstetrics and Gynecology | Admitting: Obstetrics and Gynecology

## 2021-02-28 ENCOUNTER — Encounter: Payer: Self-pay | Admitting: Obstetrics

## 2021-02-28 ENCOUNTER — Ambulatory Visit (INDEPENDENT_AMBULATORY_CARE_PROVIDER_SITE_OTHER): Payer: BC Managed Care – PPO | Admitting: Obstetrics

## 2021-02-28 VITALS — BP 128/74 | Ht 64.0 in | Wt 271.0 lb

## 2021-02-28 DIAGNOSIS — Z124 Encounter for screening for malignant neoplasm of cervix: Secondary | ICD-10-CM

## 2021-02-28 DIAGNOSIS — Z113 Encounter for screening for infections with a predominantly sexual mode of transmission: Secondary | ICD-10-CM

## 2021-02-28 DIAGNOSIS — Z01419 Encounter for gynecological examination (general) (routine) without abnormal findings: Secondary | ICD-10-CM

## 2021-02-28 DIAGNOSIS — Z1231 Encounter for screening mammogram for malignant neoplasm of breast: Secondary | ICD-10-CM

## 2021-02-28 NOTE — Progress Notes (Signed)
Gynecology Annual Exam  PCP: Jon Billings, NP  Chief Complaint:  Chief Complaint  Patient presents with   Annual Exam    History of Present Illness:Patient is a 50 y.o. G2P1011 presents for annual exam. The patient has no complaints today. She is new to [place], and cannot remember when she last had a pap smear.she has a previous HX of HSV. 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  LMP: No LMP recorded (lmp unknown). Patient has had a hysterectomy.   The patient is currently sexually active. She denies dyspareunia.  The patient does not perform self breast exams.  There is notable family history of breast or ovarian cancer in her family.  The patient wears seatbelts: yes.   The patient has regular exercise: no.    The patient denies current symptoms of depression.     Review of Systems: Review of Systems  Constitutional: Negative.   HENT: Negative.    Cardiovascular: Negative.   Gastrointestinal: Negative.   Genitourinary: Negative.   Skin: Negative.   Neurological: Negative.   Endo/Heme/Allergies: Negative.   Psychiatric/Behavioral:  The patient is nervous/anxious.        Rx for Buspar noted in her medication review.   Past Medical History:  Patient Active Problem List   Diagnosis Date Noted   Pain of foot 07/18/2020   Allergic rhinitis 08/01/2018   Abnormal MRI, breast 04/09/2017   Plantar fasciitis, bilateral 04/09/2017   ANA positive 11/04/2015   Vitamin D deficiency 11/03/2015   Bariatric surgery status 11/03/2015   Hidradenitis suppurativa 03/30/2015   Morbid obesity ([place]) 03/30/2015   Herpes simplex 03/30/2015   Chronic anxiety 10/18/2014   Fibroids 07/20/2014   Family history of ovarian cancer    Family history of breast cancer     Past Surgical History:  Past Surgical  History:  Procedure Laterality Date   ABDOMINAL HYSTERECTOMY     BREAST SURGERY     breast biopsy 4 months ago, left breast   CHOLECYSTECTOMY     GASTRIC BYPASS  2000   LAPAROSCOPIC BILATERAL SALPINGECTOMY Bilateral 07/20/2014   Procedure: LAPAROSCOPIC BILATERAL SALPINGECTOMY;  Surgeon: Gae Dry, MD;  Location: [facility];  Service: Gynecology;  Laterality: Bilateral;   LAPAROSCOPIC HYSTERECTOMY N/A 07/20/2014   Procedure: HYSTERECTOMY TOTAL LAPAROSCOPIC;  Surgeon: Gae Dry, MD;  Location: [facility];  Service: Gynecology;  Laterality: N/A;   LAPAROSCOPIC OOPHERECTOMY Bilateral 07/20/2014   Procedure: LAPAROSCOPIC OOPHERECTOMY-(POSSIBLE);  Surgeon: Gae Dry, MD;  Location: [facility] OR[facility];  Service: Gynecology;  Laterality: Bilateral;    Gynecologic History:  No LMP recorded (lmp unknown). Patient has had a hysterectomy. Last Pap: Results were: no records available. She admits it has been a long time. Had a hysterectomy in 2016  unknown   Last mammogram: 2018 Results were: n findings. She has not had any annual mammograms since 2018  Obstetric History: G2P1011  Family History:  Family History  Problem Relation Age of Onset   Ovarian cancer Mother 15   Breast cancer Maternal Aunt 55   Cancer Paternal Aunt        either cervical or ovarian cancer   Cervical cancer Maternal Aunt        reportedly the cause of death was cervical cancer   Lung cancer Paternal 36  Breast cancer Cousin    Breast cancer Cousin     Social History:  Social History   Socioeconomic History   Marital status: Married    Spouse name: Angelicia Lessner   Number of children: 1   Years of education: Not on file   Highest education level: Master's degree (e.g., MA, MS, MEng, MEd, MSW, MBA)  Occupational History   Occupation: [employer]  Tobacco Use   Smoking status: Never   Smokeless tobacco: Never  Vaping Use   Vaping Use: Never used  Substance and Sexual Activity   Alcohol use: Yes     Comment: socially   Drug use: No   Sexual activity: Not Currently  Other Topics Concern   Not on file  Social History Narrative   Not on file   Social Determinants of Health   Financial Resource Strain: Not on file  Food Insecurity: Not on file  Transportation Needs: Not on file  Physical Activity: Not on file  Stress: Not on file  Social Connections: Not on file  Intimate Partner Violence: Not on file    Allergies:  Allergies  Allergen Reactions   Latex Itching and Rash    Medications: Prior to Admission medications   Medication Sig Start Date End Date Taking? Authorizing Provider  amoxicillin-clavulanate (AUGMENTIN) 875-125 MG tablet Take 1 tablet by mouth 2 (two) times daily. 11/24/20  Yes McElwee, Lauren A, NP  busPIRone (BUSPAR) 15 MG tablet Take 1 tablet (15 mg total) by mouth 2 (two) times daily. 05/12/20  Yes Johnson, Megan P, DO  diclofenac Sodium (VOLTAREN) 1 % GEL Apply 4 g topically 4 (four) times daily. 05/12/20  Yes Johnson, Megan P, DO  etodolac (LODINE) 500 MG tablet Take 1 tablet (500 mg total) by mouth 2 (two) times daily. 12/28/20  Yes Felipa Furnace, DPM  fluticasone (FLONASE) 50 MCG/ACT nasal spray Place 2 sprays into both nostrils daily. 11/21/20  Yes Jon Billings, NP  LORazepam (ATIVAN) 0.5 MG tablet Take one tab daily as needed for severe panic attacks. Not to be used every day. 11/21/20  Yes Jon Billings, NP    Physical Exam Vitals: Blood pressure 128/74, height 5\' 4"  (1.626 m), weight 271 lb (122.9 kg).  General: NAD HEENT: normocephalic, anicteric Thyroid: no enlargement, no palpable nodules Pulmonary: No increased work of breathing, CTAB Cardiovascular: RRR, distal pulses 2+ Breast: Breast symmetrical, no tenderness, no palpable nodules or masses, no skin or nipple retraction present, no nipple discharge.  No axillary or supraclavicular lymphadenopathy. Abdomen: NABS, soft, non-tender, non-distended.  Umbilicus without lesions.  No  hepatomegaly, splenomegaly or masses palpable. No evidence of hernia  Genitourinary:  External: Normal external female genitalia.  Normal urethral meatus, normal Bartholin's and Skene's glands.    Vagina: Normal vaginal mucosa, no evidence of prolapse.    Cervix: Grossly normal in appearance, no bleeding  Uterus: Non-enlarged, mobile, normal contour.  No CMT  Adnexa: ovaries non-enlarged, no adnexal masses  Rectal: deferred  Lymphatic: no evidence of inguinal lymphadenopathy Extremities: no edema, erythema, or tenderness Neurologic: Grossly intact Psychiatric: mood appropriate, affect full  Female chaperone present for pelvic and breast  portions of the physical exam     Assessment: 50 y.o. G2P1011 routine annual exam  Plan: Problem List Items Addressed This Visit   None   1) Mammogram - recommend yearly screening mammogram.  Mammogram Was ordered today  2) STI screening  wasoffered and accepted  3) ASCCP guidelines and rational discussed.  Patient opts for every  5 years screening interval  4) Osteoporosis  - per USPTF routine screening DEXA at age 59   Consider FDA-approved medical therapies in postmenopausal women and men aged 13 years and older, based on the following: a) A hip or vertebral (clinical or morphometric) fracture b) T-score ? -2.5 at the femoral neck or spine after appropriate evaluation to exclude secondary causes C) Low bone mass (T-score between -1.0 and -2.5 at the femoral neck or spine) and a 10-year probability of a hip fracture ? 3% or a 10-year probability of a major osteoporosis-related fracture ? 20% based on the [place]-adapted WHO algorithm   5) Routine healthcare maintenance including cholesterol, diabetes screening discussed managed by PCP  6) Colonoscopy per her PCP.  Screening recommended starting at age 82 for average risk individuals, age 18 for individuals deemed at increased risk (including African Americans) and recommended to continue until  age 11.  For patient age 58-85 individualized approach is recommended.  Gold standard screening is via colonoscopy, Cologuard screening is an acceptable alternative for patient unwilling or unable to undergo colonoscopy.  "Colorectal cancer screening for average?risk adults: 2018 guideline update from the American Cancer Society"CA: A Cancer Journal for Clinicians: Aug 01, 2016   7) No follow-ups on file.Encouraged to RTC in one year. Will contact her with the results of her pap and mammogram. And the Aptima swab today.    Imagene Riches, CNM  02/28/2021 1:43 PM   [facility] 02/28/2021, 1:43 PM

## 2021-03-01 ENCOUNTER — Other Ambulatory Visit: Payer: Self-pay | Admitting: Obstetrics

## 2021-03-02 ENCOUNTER — Encounter: Payer: Self-pay | Admitting: Obstetrics

## 2021-03-02 DIAGNOSIS — N760 Acute vaginitis: Secondary | ICD-10-CM

## 2021-03-02 LAB — CERVICOVAGINAL ANCILLARY ONLY
Bacterial Vaginitis (gardnerella): POSITIVE — AB
Chlamydia: NEGATIVE
Comment: NEGATIVE
Comment: NEGATIVE
Comment: NEGATIVE
Comment: NORMAL
Neisseria Gonorrhea: NEGATIVE
Trichomonas: NEGATIVE

## 2021-03-02 MED ORDER — METRONIDAZOLE 500 MG PO TABS: 500.0000 mg | ORAL_TABLET | Freq: Two times a day (BID) | ORAL | 0 refills | Status: AC

## 2021-03-08 LAB — CYTOLOGY - PAP
Comment: NEGATIVE
Comment: NEGATIVE
Diagnosis: NEGATIVE
HPV 16: NEGATIVE
HPV 18 / 45: NEGATIVE
High risk HPV: POSITIVE — AB

## 2021-03-08 NOTE — Progress Notes (Signed)
Pap smear result shows NILM; some possible yeast, and the presence of High Risk HPV, but negative for types 16 and 18. Advised patient that we can repeat the pap next year.  Imagene Riches, CNM  03/08/2021 5:08 PM

## 2021-03-21 ENCOUNTER — Other Ambulatory Visit: Payer: Self-pay | Admitting: Obstetrics

## 2021-03-21 ENCOUNTER — Encounter: Payer: Self-pay | Admitting: Obstetrics

## 2021-03-21 DIAGNOSIS — N760 Acute vaginitis: Secondary | ICD-10-CM

## 2021-03-21 DIAGNOSIS — B9689 Other specified bacterial agents as the cause of diseases classified elsewhere: Secondary | ICD-10-CM

## 2021-03-21 MED ORDER — METRONIDAZOLE 500 MG PO TABS
500.0000 mg | ORAL_TABLET | Freq: Two times a day (BID) | ORAL | 0 refills | Status: AC
Start: 2021-03-21 — End: 2021-03-28

## 2021-03-27 ENCOUNTER — Other Ambulatory Visit: Payer: Self-pay | Admitting: Obstetrics

## 2021-03-27 DIAGNOSIS — B009 Herpesviral infection, unspecified: Secondary | ICD-10-CM

## 2021-03-27 MED ORDER — VALACYCLOVIR HCL 500 MG PO TABS: 500.0000 mg | ORAL_TABLET | Freq: Every day | ORAL | 11 refills | Status: DC

## 2021-06-06 ENCOUNTER — Other Ambulatory Visit: Payer: Self-pay | Admitting: Nurse Practitioner

## 2021-06-06 DIAGNOSIS — R0981 Nasal congestion: Secondary | ICD-10-CM

## 2021-06-09 ENCOUNTER — Other Ambulatory Visit: Payer: Self-pay | Admitting: Nurse Practitioner

## 2021-06-09 DIAGNOSIS — F418 Other specified anxiety disorders: Secondary | ICD-10-CM

## 2021-11-24 ENCOUNTER — Other Ambulatory Visit: Payer: Self-pay | Admitting: Family Medicine

## 2021-11-24 NOTE — Telephone Encounter (Signed)
Requested medication (s) are due for refill today - expired Rx  Requested medication (s) are on the active medication list -yes  Future visit scheduled -no  Last refill: 05/12/20 #180 1RF  Notes to clinic: expired Rx, call to patient- no longer current patient  Requested Prescriptions  Pending Prescriptions Disp Refills   busPIRone (BUSPAR) 15 MG tablet [Pharmacy Med Name: BUSPIRONE '15MG'$  TABLETS] 180 tablet 1    Sig: TAKE 1 TABLET(15 MG) BY MOUTH TWICE DAILY     Psychiatry: Anxiolytics/Hypnotics - Non-controlled Failed - 11/24/2021  1:49 PM      Failed - Valid encounter within last 12 months    Recent Outpatient Visits           1 year ago Upper respiratory tract infection, unspecified type   [facility] McElwee, Lauren A, NP   1 year ago Pruritus   [facility] Jon Billings, NP   1 year ago Pain of foot, unspecified laterality   [facility] Jon Billings, NP   1 year ago Routine general medical examination at a health care facility   [facility], [place], DO   1 year ago Viral upper respiratory tract infection   [facility] Kathrine Haddock, NP                 Requested Prescriptions  Pending Prescriptions Disp Refills   busPIRone (BUSPAR) 15 MG tablet [Pharmacy Med Name: BUSPIRONE '15MG'$  TABLETS] 180 tablet 1    Sig: TAKE 1 TABLET(15 MG) BY MOUTH TWICE DAILY     Psychiatry: Anxiolytics/Hypnotics - Non-controlled Failed - 11/24/2021  1:49 PM      Failed - Valid encounter within last 12 months    Recent Outpatient Visits           1 year ago Upper respiratory tract infection, unspecified type   [place] McElwee, Lauren A, NP   1 year ago Pruritus   [facility] Jon Billings, NP   1 year ago Pain of foot, unspecified laterality   [facility] Jon Billings, NP   1 year ago Routine general medical examination at a health care facility    [facility], [place], DO   1 year ago Viral upper respiratory tract infection   [facility] Kathrine Haddock, NP

## 2023-11-05 ENCOUNTER — Ambulatory Visit: Payer: Self-pay | Admitting: Podiatry

## 2023-11-08 ENCOUNTER — Other Ambulatory Visit

## 2023-11-08 ENCOUNTER — Ambulatory Visit: Admitting: Certified Nurse Midwife

## 2023-11-08 ENCOUNTER — Encounter: Payer: Self-pay | Admitting: Certified Nurse Midwife

## 2023-11-08 ENCOUNTER — Other Ambulatory Visit (HOSPITAL_COMMUNITY)
Admission: RE | Admit: 2023-11-08 | Discharge: 2023-11-08 | Disposition: A | Discharge status: Home / Self Care | Source: Ambulatory Visit | Attending: Certified Nurse Midwife | Admitting: Certified Nurse Midwife

## 2023-11-08 VITALS — BP 124/84 | HR 64 | Wt 286.8 lb

## 2023-11-08 DIAGNOSIS — Z113 Encounter for screening for infections with a predominantly sexual mode of transmission: Secondary | ICD-10-CM | POA: Insufficient documentation

## 2023-11-08 MED ORDER — VALACYCLOVIR HCL 1 G PO TABS
1000.0000 mg | ORAL_TABLET | Freq: Every day | ORAL | 6 refills | Status: AC
Start: 2023-11-08 — End: 2023-11-13

## 2023-11-08 NOTE — Addendum Note (Signed)
 Addended by: DELANA SAUER on: 11/08/2023 12:09 PM   Modules accepted: Orders

## 2023-11-08 NOTE — Progress Notes (Signed)
 GYN ENCOUNTER NOTE  Subjective:       Jasmine Tucker is a 53 y.o. G96P1011 female is here for gynecologic evaluation of the following issues:  1. Screening for STD . She is requesting vaginal , blood , and oral swab.  She notes she has history of HSV and request refill on her medication.     Gynecologic History No LMP recorded (lmp unknown). Patient has had a hysterectomy. Contraception: status post hysterectomy Last Pap: 2022. Results were: abnormal HPV positive  Last mammogram: 11/26/16. Results were: normal  Obstetric History OB History  Gravida Para Term Preterm AB Living  2 1 1  1 1   SAB IAB Ectopic Multiple Live Births  1    1    # Outcome Date GA Lbr Len/2nd Weight Sex Type Anes PTL Lv  2 SAB           1 Term             Past Medical History:  Diagnosis Date   Anemia    Anxiety    BRCA negative 2016   21 panel test done at [facility]   Family history of breast cancer    Family history of ovarian cancer    GERD (gastroesophageal reflux disease)    Menorrhagia    Plantar fasciitis 2017   Thrombocytosis     Past Surgical History:  Procedure Laterality Date   ABDOMINAL HYSTERECTOMY     BREAST SURGERY     breast biopsy 4 months ago, left breast   CHOLECYSTECTOMY     GASTRIC BYPASS  2000   LAPAROSCOPIC BILATERAL SALPINGECTOMY Bilateral 07/20/2014   Procedure: LAPAROSCOPIC BILATERAL SALPINGECTOMY;  Surgeon: Lamar SHAUNNA Lesches, MD;  Location: [facility] OR[facility];  Service: Gynecology;  Laterality: Bilateral;   LAPAROSCOPIC HYSTERECTOMY N/A 07/20/2014   Procedure: HYSTERECTOMY TOTAL LAPAROSCOPIC;  Surgeon: Lamar SHAUNNA Lesches, MD;  Location: [facility] OR[facility];  Service: Gynecology;  Laterality: N/A;   LAPAROSCOPIC OOPHERECTOMY Bilateral 07/20/2014   Procedure: LAPAROSCOPIC OOPHERECTOMY-(POSSIBLE);  Surgeon: Lamar SHAUNNA Lesches, MD;  Location: [facility] OR[facility];  Service: Gynecology;  Laterality: Bilateral;    Current Outpatient Medications on File Prior to Visit  Medication Sig Dispense Refill    busPIRone  (BUSPAR ) 15 MG tablet Take 1 tablet (15 mg total) by mouth 2 (two) times daily. 180 tablet 1   diclofenac  Sodium (VOLTAREN ) 1 % GEL Apply 4 g topically 4 (four) times daily. 100 g 12   etodolac  (LODINE ) 500 MG tablet Take 1 tablet (500 mg total) by mouth 2 (two) times daily. 60 tablet 0   fluticasone  (FLONASE ) 50 MCG/ACT nasal spray Place 2 sprays into both nostrils daily. 48 g 0   LORazepam  (ATIVAN ) 0.5 MG tablet Take one tab daily as needed for severe panic attacks. Not to be used every day. 20 tablet 0   valACYclovir  (VALTREX ) 500 MG tablet Take 1 tablet (500 mg total) by mouth daily. 30 tablet 11   amoxicillin -clavulanate (AUGMENTIN ) 875-125 MG tablet Take 1 tablet by mouth 2 (two) times daily. 20 tablet 0   No current facility-administered medications on file prior to visit.    Allergies  Allergen Reactions   Latex Itching and Rash    Social History   Socioeconomic History   Marital status: Married    Spouse name: Rayley Gao   Number of children: 1   Years of education: Not on file   Highest education level: Master's degree (e.g., MA, MS, MEng, MEd, MSW, MBA)  Occupational History   Occupation:  [employer]  Tobacco Use   Smoking status: Never   Smokeless tobacco: Never  Vaping Use   Vaping status: Never Used  Substance and Sexual Activity   Alcohol use: Yes    Comment: socially   Drug use: No   Sexual activity: Yes    Birth control/protection: Surgical  Other Topics Concern   Not on file  Social History Narrative   Not on file   Social Drivers of Health   Financial Resource Strain: Patient Declined (04/03/2023)   Received from [facility]   Overall Financial Resource Strain (CARDIA)    Difficulty of Paying Living Expenses: Patient declined  Food Insecurity: Patient Declined (04/03/2023)   Received from [facility]   Hunger Vital Sign    Within the past 12 months, you worried that your food would run out  before you got the money to buy more.: Patient declined    Within the past 12 months, the food you bought just didn't last and you didn't have money to get more.: Patient declined  Transportation Needs: Patient Declined (04/03/2023)   Received from [facility] - Transportation    In the past 12 months, has lack of transportation kept you from medical appointments or from getting medications?: Patient declined    Lack of Transportation (Non-Medical): Patient declined  Physical Activity: Inactive (04/07/2018)   Exercise Vital Sign    Days of Exercise per Week: 0 days    Minutes of Exercise per Session: 0 min  Stress: Stress Concern Present (04/07/2018)   [employer] of Occupational Health - Occupational Stress Questionnaire    Feeling of Stress : Rather much  Social Connections: Somewhat Isolated (04/07/2018)   Social Connection and Isolation Panel    Frequency of Communication with Friends and Family: More than three times a week    Frequency of Social Gatherings with Friends and Family: Once a week    Attends Religious Services: Never    Database administrator or Organizations: No    Attends Banker Meetings: Never    Marital Status: Married  Catering manager Violence: Not At Risk (04/07/2018)   Humiliation, Afraid, Rape, and Kick questionnaire    Fear of Current or Ex-Partner: No    Emotionally Abused: No    Physically Abused: No    Sexually Abused: No    Family History  Problem Relation Age of Onset   Ovarian cancer Mother 19   Breast cancer Maternal Aunt 38   Cancer Paternal Aunt        either cervical or ovarian cancer   Cervical cancer Maternal Aunt        reportedly the cause of death was cervical cancer   Lung cancer Paternal Aunt    Breast cancer Cousin    Breast cancer Cousin     The following portions of the patient's history were reviewed and updated as appropriate: allergies, current medications, past family history, past  medical history, past social history, past surgical history and problem list.  Review of Systems Review of Systems - Negative except as mentioned in HPI Review of Systems - General ROS: negative for - chills, fatigue, fever, hot flashes, malaise or night sweats Hematological and Lymphatic ROS: negative for - bleeding problems or swollen lymph nodes Gastrointestinal ROS: negative for - abdominal pain, blood in stools, change in bowel habits and nausea/vomiting Musculoskeletal ROS: negative for - joint pain, muscle pain or muscular weakness Genito-Urinary ROS: negative  for - change in menstrual cycle, dysmenorrhea, dyspareunia, dysuria, genital discharge, genital ulcers, hematuria, incontinence, irregular/heavy menses, nocturia or pelvic pain  Objective:   BP 124/84   Pulse 64   Wt 286 lb 12.8 oz (130.1 kg)   LMP  (LMP Unknown)   BMI 49.23 kg/m  CONSTITUTIONAL: Well-developed, well-nourished female in no acute distress.  HENT:  Normocephalic, atraumatic.  NECK: Normal range of motion, supple, no masses.  Normal thyroid.  SKIN: Skin is warm and dry. No rash noted. Not diaphoretic. No erythema. No pallor. NEUROLGIC: Alert and oriented to person, place, and time. PSYCHIATRIC: Normal mood and affect. Normal behavior. Normal judgment and thought content. CARDIOVASCULAR:Not Examined RESPIRATORY: Not Examined BREASTS: Not Examined ABDOMEN: Soft, non distended; Non tender.  No Organomegaly. PELVIC:  External Genitalia: Normal  BUS: Normal  Vagina: Normal  Cervix: Normal, white discharge , no odor Difficult to visualize due to prolapsing of vaginal tissue around the speculum. Pt state she had a total hysterectomy to strong family hx of ovarian cancer.   MUSCULOSKELETAL: Normal range of motion. No tenderness.  No cyanosis, clubbing, or edema.     Assessment:   1. Screening examination for STD (sexually transmitted disease) (Primary)  - HEP, RPR, HIV Panel - Cervicovaginal ancillary  only     Plan:   Vaginal and oral swab collected. Blood work ordered. Pt encouraged to return for annual exam . Will follow up with lab results. Orders placed for valtrex  .  Zelda Hummer, CNM

## 2023-11-09 LAB — HEP, RPR, HIV PANEL
HIV Screen 4th Generation wRfx: NONREACTIVE
Hepatitis B Surface Ag: NEGATIVE
RPR Ser Ql: NONREACTIVE

## 2023-11-11 ENCOUNTER — Ambulatory Visit: Payer: Self-pay | Admitting: Certified Nurse Midwife

## 2023-11-11 LAB — CYTOLOGY, (ORAL, ANAL, URETHRAL) ANCILLARY ONLY
Chlamydia: NEGATIVE
Comment: NEGATIVE
Comment: NORMAL
Neisseria Gonorrhea: NEGATIVE

## 2023-11-11 LAB — CERVICOVAGINAL ANCILLARY ONLY
Bacterial Vaginitis (gardnerella): POSITIVE — AB
Candida Glabrata: NEGATIVE
Candida Vaginitis: NEGATIVE
Chlamydia: NEGATIVE
Comment: NEGATIVE
Comment: NEGATIVE
Comment: NEGATIVE
Comment: NEGATIVE
Comment: NEGATIVE
Comment: NORMAL
Neisseria Gonorrhea: NEGATIVE
Trichomonas: NEGATIVE

## 2023-11-12 ENCOUNTER — Encounter: Payer: Self-pay | Admitting: Certified Nurse Midwife

## 2023-11-12 ENCOUNTER — Other Ambulatory Visit: Payer: Self-pay | Admitting: Certified Nurse Midwife

## 2023-11-12 MED ORDER — METRONIDAZOLE 500 MG PO TABS: 500.0000 mg | ORAL_TABLET | Freq: Two times a day (BID) | ORAL | 0 refills | Status: AC
